# Patient Record
Sex: Female | Born: 1985 | Race: White | Hispanic: No | Marital: Single | Smoking: Current some day smoker
Health system: Southern US, Community
[De-identification: ages and names within clinical notes are randomized; demographics above are authoritative.]

## PROBLEM LIST (undated history)

## (undated) DIAGNOSIS — D649 Anemia, unspecified: Secondary | ICD-10-CM

## (undated) DIAGNOSIS — F4310 Post-traumatic stress disorder, unspecified: Secondary | ICD-10-CM

## (undated) DIAGNOSIS — F32A Depression, unspecified: Secondary | ICD-10-CM

## (undated) DIAGNOSIS — Z87442 Personal history of urinary calculi: Secondary | ICD-10-CM

## (undated) DIAGNOSIS — R059 Cough, unspecified: Secondary | ICD-10-CM

## (undated) DIAGNOSIS — F419 Anxiety disorder, unspecified: Secondary | ICD-10-CM

## (undated) DIAGNOSIS — F319 Bipolar disorder, unspecified: Secondary | ICD-10-CM

## (undated) DIAGNOSIS — F329 Major depressive disorder, single episode, unspecified: Secondary | ICD-10-CM

## (undated) DIAGNOSIS — J45909 Unspecified asthma, uncomplicated: Secondary | ICD-10-CM

## (undated) DIAGNOSIS — R519 Headache, unspecified: Secondary | ICD-10-CM

## (undated) DIAGNOSIS — R51 Headache: Secondary | ICD-10-CM

## (undated) DIAGNOSIS — R05 Cough: Secondary | ICD-10-CM

## (undated) HISTORY — DX: Depression, unspecified: F32.A

## (undated) HISTORY — PX: TONSILLECTOMY: SUR1361

## (undated) HISTORY — DX: Major depressive disorder, single episode, unspecified: F32.9

## (undated) HISTORY — PX: TUBAL LIGATION: SHX77

## (undated) HISTORY — PX: ABDOMINAL HYSTERECTOMY: SHX81

## (undated) HISTORY — PX: BREAST SURGERY: SHX581

## (undated) HISTORY — PX: HERNIA REPAIR: SHX51

## (undated) HISTORY — DX: Post-traumatic stress disorder, unspecified: F43.10

## (undated) HISTORY — DX: Anxiety disorder, unspecified: F41.9

## (undated) HISTORY — PX: CHOLECYSTECTOMY: SHX55

---
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
## 2011-10-09 ENCOUNTER — Inpatient Hospital Stay: Payer: Self-pay | Admitting: Psychiatry

## 2012-02-16 ENCOUNTER — Ambulatory Visit: Payer: Self-pay | Admitting: Family Medicine

## 2012-03-18 ENCOUNTER — Ambulatory Visit: Payer: Self-pay | Admitting: Medical

## 2012-03-18 LAB — URINALYSIS, COMPLETE
Blood: NEGATIVE
Leukocyte Esterase: NEGATIVE
Ph: 6 (ref 4.5–8.0)
RBC,UR: NONE SEEN /HPF (ref 0–5)
Specific Gravity: 1.025 (ref 1.003–1.030)

## 2012-03-18 LAB — COMPREHENSIVE METABOLIC PANEL
Alkaline Phosphatase: 68 U/L (ref 50–136)
Anion Gap: 8 (ref 7–16)
BUN: 7 mg/dL (ref 7–18)
Co2: 25 mmol/L (ref 21–32)
Creatinine: 0.65 mg/dL (ref 0.60–1.30)
EGFR (African American): >60
Osmolality: 267 (ref 275–301)
Potassium: 3.8 mmol/L (ref 3.5–5.1)
SGPT (ALT): 19 U/L
Sodium: 135 mmol/L — ABNORMAL LOW (ref 136–145)

## 2012-03-18 LAB — CBC WITH DIFFERENTIAL/PLATELET
Basophil #: 0 10*3/uL (ref 0.0–0.1)
Basophil %: 0.3 %
Eosinophil #: 0.2 10*3/uL (ref 0.0–0.7)
HCT: 33.7 % — ABNORMAL LOW (ref 35.0–47.0)
Lymphocyte #: 2.9 10*3/uL (ref 1.0–3.6)
Lymphocyte %: 22.8 %
MCH: 30.8 pg (ref 26.0–34.0)
MCV: 90 fL (ref 80–100)
Monocyte #: 0.9 x10 3/mm (ref 0.2–0.9)
Monocyte %: 6.9 %
Neutrophil %: 68.7 %
Platelet: 220 10*3/uL (ref 150–440)
RBC: 3.74 10*6/uL — ABNORMAL LOW (ref 3.80–5.20)
RDW: 13.6 % (ref 11.5–14.5)

## 2012-03-20 LAB — URINE CULTURE

## 2012-04-19 ENCOUNTER — Observation Stay: Payer: Self-pay | Admitting: Obstetrics and Gynecology

## 2013-03-04 DIAGNOSIS — J45909 Unspecified asthma, uncomplicated: Secondary | ICD-10-CM | POA: Insufficient documentation

## 2013-03-04 DIAGNOSIS — R0602 Shortness of breath: Secondary | ICD-10-CM | POA: Insufficient documentation

## 2013-06-12 ENCOUNTER — Ambulatory Visit: Payer: Self-pay | Admitting: Family Medicine

## 2013-11-20 ENCOUNTER — Ambulatory Visit: Payer: Self-pay | Admitting: Surgery

## 2013-11-20 LAB — PREGNANCY, URINE: Pregnancy Test, Urine: NEGATIVE m[IU]/mL

## 2013-11-22 LAB — PATHOLOGY REPORT

## 2013-11-29 ENCOUNTER — Emergency Department: Payer: Self-pay | Admitting: Internal Medicine

## 2014-03-08 DIAGNOSIS — F319 Bipolar disorder, unspecified: Secondary | ICD-10-CM | POA: Insufficient documentation

## 2014-04-25 DIAGNOSIS — R519 Headache, unspecified: Secondary | ICD-10-CM | POA: Insufficient documentation

## 2014-04-25 DIAGNOSIS — R202 Paresthesia of skin: Secondary | ICD-10-CM | POA: Insufficient documentation

## 2014-04-25 DIAGNOSIS — Z720 Tobacco use: Secondary | ICD-10-CM | POA: Insufficient documentation

## 2014-04-25 DIAGNOSIS — R51 Headache: Secondary | ICD-10-CM

## 2014-04-25 DIAGNOSIS — H538 Other visual disturbances: Secondary | ICD-10-CM | POA: Insufficient documentation

## 2014-04-25 DIAGNOSIS — F329 Major depressive disorder, single episode, unspecified: Secondary | ICD-10-CM | POA: Insufficient documentation

## 2014-04-25 DIAGNOSIS — F32A Depression, unspecified: Secondary | ICD-10-CM | POA: Insufficient documentation

## 2014-04-25 DIAGNOSIS — R251 Tremor, unspecified: Secondary | ICD-10-CM | POA: Insufficient documentation

## 2014-04-25 DIAGNOSIS — R200 Anesthesia of skin: Secondary | ICD-10-CM | POA: Insufficient documentation

## 2014-04-25 DIAGNOSIS — F17200 Nicotine dependence, unspecified, uncomplicated: Secondary | ICD-10-CM | POA: Insufficient documentation

## 2014-04-25 DIAGNOSIS — R262 Difficulty in walking, not elsewhere classified: Secondary | ICD-10-CM | POA: Insufficient documentation

## 2014-04-25 DIAGNOSIS — R479 Unspecified speech disturbances: Secondary | ICD-10-CM | POA: Insufficient documentation

## 2014-04-28 ENCOUNTER — Ambulatory Visit: Payer: Self-pay | Admitting: Physician Assistant

## 2014-04-28 LAB — URINALYSIS, COMPLETE
Bilirubin,UR: NEGATIVE
Glucose,UR: NEGATIVE mg/dL (ref 0–75)
Ketone: NEGATIVE
Nitrite: NEGATIVE
Ph: 6 (ref 4.5–8.0)
Protein: 100
Specific Gravity: 1.025 (ref 1.003–1.030)

## 2014-04-30 LAB — URINE CULTURE

## 2014-05-09 DIAGNOSIS — F419 Anxiety disorder, unspecified: Secondary | ICD-10-CM | POA: Insufficient documentation

## 2014-06-06 DIAGNOSIS — G479 Sleep disorder, unspecified: Secondary | ICD-10-CM | POA: Insufficient documentation

## 2014-09-02 ENCOUNTER — Ambulatory Visit: Payer: Self-pay | Admitting: Physician Assistant

## 2014-09-02 LAB — URINALYSIS, COMPLETE
Bilirubin,UR: NEGATIVE
GLUCOSE, UR: NEGATIVE
KETONE: NEGATIVE
Nitrite: NEGATIVE
PH: 6 (ref 5.0–8.0)
Protein: 100
Specific Gravity: >=1.03 (ref 1.000–1.030)

## 2014-09-02 LAB — PREGNANCY, URINE: Pregnancy Test, Urine: NEGATIVE m[IU]/mL

## 2014-09-04 LAB — URINE CULTURE

## 2014-09-18 DIAGNOSIS — M542 Cervicalgia: Secondary | ICD-10-CM | POA: Insufficient documentation

## 2014-09-18 DIAGNOSIS — G44221 Chronic tension-type headache, intractable: Secondary | ICD-10-CM | POA: Insufficient documentation

## 2014-09-18 DIAGNOSIS — R413 Other amnesia: Secondary | ICD-10-CM | POA: Insufficient documentation

## 2014-09-24 ENCOUNTER — Ambulatory Visit: Payer: Self-pay | Admitting: Neurology

## 2014-11-14 ENCOUNTER — Ambulatory Visit: Payer: Self-pay | Admitting: Otolaryngology

## 2014-12-01 HISTORY — PX: GALLBLADDER SURGERY: SHX652

## 2014-12-04 DIAGNOSIS — N390 Urinary tract infection, site not specified: Secondary | ICD-10-CM | POA: Insufficient documentation

## 2014-12-25 ENCOUNTER — Ambulatory Visit: Payer: Self-pay | Admitting: Neurology

## 2014-12-28 HISTORY — PX: TONSILLECTOMY AND ADENOIDECTOMY: SHX28

## 2015-02-08 NOTE — Op Note (Signed)
PATIENT NAME:  Bailey [name], Bailey [name] MR#:  [id number] DATE OF BIRTH:  Dec 09, 1985  DATE OF PROCEDURE:  11/20/2013  PREOPERATIVE DIAGNOSIS: Symptomatic cholelithiasis.   POSTOPERATIVE DIAGNOSIS: Symptomatic cholelithiasis.   PROCEDURE PERFORMED: Laparoscopic cholecystectomy.   ANESTHESIA: General.   SPECIMENS: Gallbladder.   ESTIMATED BLOOD LOSS: 15 mL.   COMPLICATIONS:  None.  INDICATION FOR SURGERY: [name] is a pleasant 29 year old female who presented to my office after recurrent episodes of right upper quadrant pain and gallstones. Her pain appeared biliary in nature, and I thought that she would benefit from laparoscopic cholecystectomy.   DETAILS OF PROCEDURE: As follows:  Informed consent was obtained. [name] was brought to the operating room suite. She was induced. Endotracheal tube was placed. General anesthesia was administered. Her abdomen was prepped and draped in standard surgical fashion. Timeout was then performed correctly identifying the patient name, operative site and procedure to be performed. A supraumbilical incision was made. This was deepened down to the fascia. The fascia was incised. The peritoneum was entered. I then placed 2 stay sutures in the fasciotomy. A Hasson trocar was placed in the abdomen and the abdomen was insufflated. An 11 mm epigastric port was placed and two 5 mm right subcostal ports were placed at the midclavicular line and anterior axillary line. The gallbladder was then retracted over the dome of the liver. There was a small amount of adhesions and distal stomach adhesions adhesed to the gallbladder. The gallbladder, cystic artery and cystic duct were then dissected out. The critical view was obtained. Three clips were placed on each structure and ligated. The gallbladder was then taken off the gallbladder fossa and taken out with an Endo Catch bag. The gallbladder fossa was then irrigated and made hemostatic. The trocars were then removed under  direct visualization. The pneumoperitoneum was evacuated. The supraumbilical fascia was closed with a figure-of-eight 0 Vicryl. All port sites were then closed with 4-0 Monocryl in deep dermal. Steri-Strips, Telfa gauze and Tegaderm completed the dressing. The patient was then awoken, extubated and brought to the postanesthesia care unit. There were no immediate complications. Needle, sponge and instrument counts were correct at the end of the procedure.    ____________________________ Si Raiderhristopher A. Shantaya Bluestone, MD cal:dmm D: 11/20/2013 12:29:54 ET T: 11/20/2013 12:45:07 ET JOB#: [id number]  cc: Cristal Deerhristopher A. Rita Vialpando, MD, <Dictator> Jarvis NewcomerHRISTOPHER A Yenesis Even MD ELECTRONICALLY SIGNED 11/23/2013 19:07

## 2015-02-10 LAB — SURGICAL PATHOLOGY

## 2015-05-05 ENCOUNTER — Ambulatory Visit (INDEPENDENT_AMBULATORY_CARE_PROVIDER_SITE_OTHER): Payer: Medicaid Other | Admitting: Psychiatry

## 2015-05-05 ENCOUNTER — Encounter: Payer: Self-pay | Admitting: Psychiatry

## 2015-05-05 ENCOUNTER — Telehealth: Payer: Self-pay

## 2015-05-05 VITALS — BP 118/78 | HR 86 | Temp 97.2°F | Ht 67.0 in | Wt 199.6 lb

## 2015-05-05 DIAGNOSIS — F331 Major depressive disorder, recurrent, moderate: Secondary | ICD-10-CM | POA: Diagnosis not present

## 2015-05-05 DIAGNOSIS — F4310 Post-traumatic stress disorder, unspecified: Secondary | ICD-10-CM

## 2015-05-05 DIAGNOSIS — F4001 Agoraphobia with panic disorder: Secondary | ICD-10-CM | POA: Diagnosis not present

## 2015-05-05 DIAGNOSIS — J45909 Unspecified asthma, uncomplicated: Secondary | ICD-10-CM | POA: Insufficient documentation

## 2015-05-05 MED ORDER — BUPROPION HCL ER (XL) 150 MG PO TB24: 150.0000 mg | ORAL_TABLET | Freq: Every day | ORAL | Status: DC

## 2015-05-05 MED ORDER — ALPRAZOLAM 1 MG PO TABS: 2.0000 mg | ORAL_TABLET | Freq: Two times a day (BID) | ORAL | Status: DC | PRN

## 2015-05-05 MED ORDER — ALPRAZOLAM 2 MG PO TABS: 2.0000 mg | ORAL_TABLET | Freq: Two times a day (BID) | ORAL | Status: DC | PRN

## 2015-05-05 MED ORDER — SERTRALINE HCL 100 MG PO TABS: 200.0000 mg | ORAL_TABLET | Freq: Every day | ORAL | Status: DC

## 2015-05-05 MED ORDER — QUETIAPINE FUMARATE 200 MG PO TABS: 300.0000 mg | ORAL_TABLET | Freq: Every day | ORAL | Status: DC
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
## 2015-05-05 NOTE — Telephone Encounter (Signed)
i had called [facility] and per the labtech they were unable to locate the labwork.  they did say that during the dates of 2-11th and 3-22nd that labwork was last during the transfer of medical info to the new system.  i did call [facility] just to make sure that patient did not go there for labwork.  Per [facility] no labwork was found for patient name and dob.  Pt was called and asked to make sure that labwork was done at the [facility] lab.  Pt confirm that labwork was done at [facility]. Pt states that it would have been done during the dates of feb -11th and march 22nd.  Pt states that if she needs to retake labwork it would be ok.  Please order and i'll contact pt that orders are ready.

## 2015-05-07 NOTE — Telephone Encounter (Signed)
Could not leave a message for patient . Will try to call again tomorrow

## 2015-05-12 NOTE — Telephone Encounter (Signed)
left message for her to call our office.

## 2015-05-13 NOTE — Telephone Encounter (Signed)
tried again today no answer could not leave a message voice mailbox full

## 2015-05-30 ENCOUNTER — Encounter: Payer: Self-pay | Admitting: Emergency Medicine

## 2015-05-30 ENCOUNTER — Ambulatory Visit
Admission: EM | Admit: 2015-05-30 | Discharge: 2015-05-30 | Disposition: A | Discharge status: Home / Self Care | Payer: Medicaid Other | Attending: Family Medicine | Admitting: Family Medicine

## 2015-05-30 DIAGNOSIS — E119 Type 2 diabetes mellitus without complications: Secondary | ICD-10-CM | POA: Diagnosis not present

## 2015-05-30 DIAGNOSIS — F329 Major depressive disorder, single episode, unspecified: Secondary | ICD-10-CM | POA: Insufficient documentation

## 2015-05-30 DIAGNOSIS — F4310 Post-traumatic stress disorder, unspecified: Secondary | ICD-10-CM | POA: Diagnosis not present

## 2015-05-30 DIAGNOSIS — F419 Anxiety disorder, unspecified: Secondary | ICD-10-CM | POA: Diagnosis not present

## 2015-05-30 DIAGNOSIS — N390 Urinary tract infection, site not specified: Secondary | ICD-10-CM | POA: Insufficient documentation

## 2015-05-30 DIAGNOSIS — N189 Chronic kidney disease, unspecified: Secondary | ICD-10-CM | POA: Insufficient documentation

## 2015-05-30 DIAGNOSIS — F17210 Nicotine dependence, cigarettes, uncomplicated: Secondary | ICD-10-CM | POA: Insufficient documentation

## 2015-05-30 DIAGNOSIS — R300 Dysuria: Secondary | ICD-10-CM | POA: Diagnosis present

## 2015-05-30 LAB — BASIC METABOLIC PANEL
Anion gap: 8 (ref 5–15)
BUN: 8 mg/dL (ref 6–20)
CALCIUM: 9.4 mg/dL (ref 8.9–10.3)
CO2: 25 mmol/L (ref 22–32)
Chloride: 104 mmol/L (ref 101–111)
Creatinine, Ser: 0.78 mg/dL (ref 0.44–1.00)
GFR calc Af Amer: >60 mL/min (ref >60)
GLUCOSE: 81 mg/dL (ref 65–99)
Potassium: 3.9 mmol/L (ref 3.5–5.1)
SODIUM: 137 mmol/L (ref 135–145)

## 2015-05-30 LAB — URINALYSIS COMPLETE WITH MICROSCOPIC (ARMC ONLY)
Glucose, UA: NEGATIVE mg/dL
Ketones, ur: NEGATIVE mg/dL
Nitrite: NEGATIVE
Protein, ur: 100 mg/dL — AB
SPECIFIC GRAVITY, URINE: 1.03 (ref 1.005–1.030)
pH: 6 (ref 5.0–8.0)

## 2015-05-30 MED ORDER — FLUCONAZOLE 150 MG PO TABS: 150.0000 mg | ORAL_TABLET | Freq: Every day | ORAL | Status: DC

## 2015-05-30 MED ORDER — CIPROFLOXACIN HCL 500 MG PO TABS: 500.0000 mg | ORAL_TABLET | Freq: Two times a day (BID) | ORAL | Status: DC

## 2015-05-30 NOTE — ED Provider Notes (Signed)
CSN: [id number]     Arrival date & time 05/30/15  1354 History   First MD Initiated Contact with Patient 05/30/15 1552     Chief Complaint  Patient presents with  . Dysuria   (Consider location/radiation/quality/duration/timing/severity/associated sxs/prior Treatment) HPI Comments: 29 yo female with a 2 days h/o urinary frequency and slight discomfort with urination. States has a h/o frequent UTIs and h/o pyelonephritis. Denies any fevers, chills, nausea, vomiting. Felt feverish today.   The history is provided by the patient.    Past Medical History  Diagnosis Date  . Diabetes mellitus, type II   . Chronic kidney disease   . PTSD (post-traumatic stress disorder)   . Depression   . Anxiety    Past Surgical History  Procedure Laterality Date  . Tonsillectomy and adenoidectomy  [id number]  . Breast reduction surgery  2004  . Gallbladder surgery  [id number]  . Cesarean section    . Breast surgery    . Tubal ligation     Family History  Problem Relation Age of Onset  . Family history unknown: Yes   Social History  Substance Use Topics  . Smoking status: Current Some Day Smoker -- 0.50 packs/day    Types: Cigarettes    Start date: 05/05/2003  . Smokeless tobacco: Never Used  . Alcohol Use: No     Comment: holiday   OB History    No data available     Review of Systems  Allergies  Sumatriptan and Hydrocodone-acetaminophen  Home Medications   Prior to Admission medications   Medication Sig Start Date End Date Taking? Authorizing Provider  albuterol (PROAIR HFA) 108 (90 BASE) MCG/ACT inhaler Inhale into the lungs. 03/04/13   Historical Provider, MD  ALPRAZolam Prudy Feeler) 2 MG tablet Take 1 tablet (2 mg total) by mouth 2 (two) times daily as needed for anxiety. 05/05/15   Kerin Salen, MD  buPROPion (WELLBUTRIN XL) 150 MG 24 hr tablet Take 1 tablet (150 mg total) by mouth daily. 05/05/15   Kerin Salen, MD  ciprofloxacin (CIPRO) 500 MG tablet Take 1 tablet (500 mg  total) by mouth every 12 (twelve) hours. 05/30/15   Payton Mccallum, MD  fluconazole (DIFLUCAN) 150 MG tablet Take 1 tablet (150 mg total) by mouth daily. 05/30/15   Payton Mccallum, MD  Ipratropium-Albuterol (COMBIVENT) 20-100 MCG/ACT AERS respimat Inhale into the lungs. 09/18/14 09/18/15  Historical Provider, MD  levonorgestrel (MIRENA) 20 MCG/24HR IUD by Intrauterine route.    Historical Provider, MD  QUEtiapine (SEROQUEL) 200 MG tablet Take 1.5 tablets (300 mg total) by mouth at bedtime. 05/05/15   Kerin Salen, MD  sertraline (ZOLOFT) 100 MG tablet Take 2 tablets (200 mg total) by mouth daily. 05/05/15   Kerin Salen, MD   BP 125/63 mmHg  Pulse 78  Temp(Src) 98.1 F (36.7 C) (Tympanic)  Resp 16  Ht  (1.727 m)  Wt 198 lb (89.812 kg)  BMI 30.11 kg/m2  SpO2 99% Physical Exam  Constitutional: She appears well-developed and well-nourished. No distress.  Abdominal: Soft. Bowel sounds are normal. She exhibits no distension and no mass. There is no tenderness. There is no rebound and no guarding.  Skin: She is not diaphoretic.  Nursing note and vitals reviewed.   ED Course  Procedures (including critical care time) Labs Review Labs Reviewed  URINALYSIS COMPLETEWITH MICROSCOPIC [facility] ONLY) - Abnormal; Notable for the following:    APPearance TURBID (*)    Bilirubin Urine 1+ (*)  Hgb urine dipstick 3+ (*)    Protein, ur 100 (*)    Leukocytes, UA 2+ (*)    Bacteria, UA FEW (*)    Squamous Epithelial / LPF 0-5 (*)    All other components within normal limits  URINE CULTURE  BASIC METABOLIC PANEL    Imaging Review No results found.   MDM   1. UTI (lower urinary tract infection)    New Prescriptions   CIPROFLOXACIN (CIPRO) 500 MG TABLET    Take 1 tablet (500 mg total) by mouth every 12 (twelve) hours.   FLUCONAZOLE (DIFLUCAN) 150 MG TABLET    Take 1 tablet (150 mg total) by mouth daily.  Plan: 1. Test results and diagnosis reviewed with patient 2. rx as per orders;  risks, benefits, potential side effects reviewed with patient 3. Recommend supportive treatment with increased fluids 4. F/u prn if symptoms worsen or don't improve    Payton Mccallum, MD 05/30/15 1711

## 2015-05-30 NOTE — ED Notes (Signed)
Patient c/o burning when urinating, and increase in urinary frequency for 2 days.  Patient report fever today.

## 2015-06-01 LAB — URINE CULTURE: Culture: >=100000

## 2015-07-07 ENCOUNTER — Ambulatory Visit: Payer: Medicaid Other | Admitting: Psychiatry

## 2015-07-29 ENCOUNTER — Ambulatory Visit (INDEPENDENT_AMBULATORY_CARE_PROVIDER_SITE_OTHER): Payer: Medicaid Other | Admitting: Psychiatry

## 2015-07-29 ENCOUNTER — Encounter: Payer: Self-pay | Admitting: Psychiatry

## 2015-07-29 ENCOUNTER — Telehealth: Payer: Self-pay

## 2015-07-29 VITALS — BP 122/76 | HR 92 | Temp 97.3°F | Ht 68.0 in | Wt 196.4 lb

## 2015-07-29 DIAGNOSIS — Z87898 Personal history of other specified conditions: Secondary | ICD-10-CM | POA: Insufficient documentation

## 2015-07-29 DIAGNOSIS — F319 Bipolar disorder, unspecified: Secondary | ICD-10-CM | POA: Insufficient documentation

## 2015-07-29 DIAGNOSIS — F331 Major depressive disorder, recurrent, moderate: Secondary | ICD-10-CM | POA: Diagnosis not present

## 2015-07-29 DIAGNOSIS — F4001 Agoraphobia with panic disorder: Secondary | ICD-10-CM | POA: Insufficient documentation

## 2015-07-29 DIAGNOSIS — F411 Generalized anxiety disorder: Secondary | ICD-10-CM | POA: Insufficient documentation

## 2015-07-29 DIAGNOSIS — F4310 Post-traumatic stress disorder, unspecified: Secondary | ICD-10-CM | POA: Diagnosis not present

## 2015-07-29 DIAGNOSIS — Z8669 Personal history of other diseases of the nervous system and sense organs: Secondary | ICD-10-CM | POA: Insufficient documentation

## 2015-07-29 DIAGNOSIS — F603 Borderline personality disorder: Secondary | ICD-10-CM | POA: Insufficient documentation

## 2015-07-29 DIAGNOSIS — Z8541 Personal history of malignant neoplasm of cervix uteri: Secondary | ICD-10-CM | POA: Insufficient documentation

## 2015-07-29 DIAGNOSIS — O24419 Gestational diabetes mellitus in pregnancy, unspecified control: Secondary | ICD-10-CM | POA: Insufficient documentation

## 2015-07-29 DIAGNOSIS — G4700 Insomnia, unspecified: Secondary | ICD-10-CM | POA: Insufficient documentation

## 2015-07-29 MED ORDER — PRAZOSIN HCL 1 MG PO CAPS: ORAL_CAPSULE | ORAL | Status: DC

## 2015-07-29 MED ORDER — QUETIAPINE FUMARATE 200 MG PO TABS: 300.0000 mg | ORAL_TABLET | Freq: Every day | ORAL | Status: DC

## 2015-07-29 MED ORDER — ALPRAZOLAM 2 MG PO TABS: 2.0000 mg | ORAL_TABLET | Freq: Two times a day (BID) | ORAL | Status: DC | PRN

## 2015-07-29 MED ORDER — SERTRALINE HCL 100 MG PO TABS: 200.0000 mg | ORAL_TABLET | Freq: Every day | ORAL | Status: DC

## 2015-07-29 MED ORDER — BUPROPION HCL ER (XL) 150 MG PO TB24: 150.0000 mg | ORAL_TABLET | Freq: Every day | ORAL | Status: DC

## 2015-07-29 NOTE — Telephone Encounter (Signed)
prior auth approved [id number]  end on  07-23-16 ref # [id number]

## 2015-07-29 NOTE — Progress Notes (Signed)
BH MD/PA/NP OP Progress Note  07/29/2015 3:09 PM Bailey [name]  MRN:  [id number]  Subjective:  Patient returns for follow-up of her PTSD, major depressive disorder and panic. She states that she is not really feeling depressed. She indicates her biggest issue is intrusive thoughts and flashbacks and nightmares. She states that good things have happened in her life and that she now has her own home. She states her daughter is there with her and then periodically her son is also there. She states that perhaps she is experiencing somewhat more re experiencing symptoms because she is now in her own environment. She states in the past she's had a female figure there or been around others. She states she continues to feel like the medications overall are helpful to her. She states the Seroquel is been helpful in terms of helping her sleep. She states that her panic attacks are generally under good control with her alprazolam.  She states she continues to go to the [facility] for trauma therapy. However she states is been an interruption because her peer counselor gone to some type of trouble and is been suspended. She states she's talk with the manager who indicate they will either get her another peer counselor or assess what is going on with her current counselor. Chief Complaint: Flashbacks Chief Complaint    Follow-up; Medication Refill     Visit Diagnosis:     ICD-9-CM ICD-10-CM   1. PTSD (post-traumatic stress disorder) 309.81 F43.10   2. 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 Major depressive disorder, recurrent episode, moderate (HCC) 296.32 F33.1     Past Medical History:  Past Medical History  Diagnosis Date  . Diabetes mellitus, type II (HCC)   . Chronic kidney disease   . PTSD (post-traumatic stress disorder)   . Depression   . Anxiety     Past Surgical History  Procedure Laterality Date  . Tonsillectomy and adenoidectomy  40981191  . Breast reduction surgery   2004  . Gallbladder surgery  [id number]  . Cesarean section    . Breast surgery    . Tubal ligation     Family History:  Family History  Problem Relation Age of Onset  . Hypertension Mother   . Anxiety disorder Mother   . Depression Mother   . Anxiety disorder Father   . Depression Father   . Diabetes Maternal Aunt   . Diabetes Maternal Grandfather    Social History:  Social History   Social History  . Marital Status: Single    Spouse Name: N/A  . Number of Children: N/A  . Years of Education: N/A   Social History Main Topics  . Smoking status: Current Some Day Smoker -- 0.50 packs/day    Types: Cigarettes    Start date: 05/05/2003  . Smokeless tobacco: Never Used  . Alcohol Use: No     Comment: holiday  . Drug Use: No  . Sexual Activity: Yes    Birth Control/ Protection: IUD   Other Topics Concern  . None   Social History Narrative   Additional History:   Assessment:   Musculoskeletal: Strength & Muscle Tone: within normal limits Gait & Station: normal Patient leans: N/A  Psychiatric Specialty Exam: HPI  Review of Systems  Psychiatric/Behavioral: Negative for depression, suicidal ideas, hallucinations, memory loss and substance abuse. The patient has insomnia. The patient is not nervous/anxious.   All other systems reviewed and are negative.   Blood pressure 122/76, pulse  92, temperature 97.3 F (36.3 C), temperature source Tympanic, height  (1.727 m), weight 196 lb 6.4 oz (89.086 kg), SpO2 96 %.Body mass index is 29.87 kg/(m^2).  General Appearance: Well Groomed  Eye Contact:  Good  Speech:  Normal Rate  Volume:  Normal  Mood:  Okay  Affect:  Appropriate and Congruent  Thought Process:  Linear and Logical  Orientation:  Full (Time, Place, and Person)  Thought Content:  Negative  Suicidal Thoughts:  No  Homicidal Thoughts:  No  Memory:  Immediate;   Good Recent;   Good Remote;   Good  Judgement:  Good  Insight:  Good  Psychomotor  Activity:  Negative  Concentration:  Good  Recall:  Good  Fund of Knowledge: Good  Language: Good  Akathisia:  Negative  Handed:  Right unknown   AIMS (if indicated):  Not done today  Assets:  Communication Skills Desire for Improvement  ADL's:  Intact  Cognition: WNL  Sleep:  poor   Is the patient at risk to self?  No. Has the patient been a risk to self in the past 6 months?  No. Has the patient been a risk to self within the distant past?  Yes.   2012-2013 Is the patient a risk to others?  No. Has the patient been a risk to others in the past 6 months?  No. Has the patient been a risk to others within the distant past?  No.  Current Medications: Current Outpatient Prescriptions  Medication Sig Dispense Refill  . albuterol (PROAIR HFA) 108 (90 BASE) MCG/ACT inhaler Inhale into the lungs.    Marland Kitchen alprazolam (XANAX) 2 MG tablet Take 1 tablet (2 mg total) by mouth 2 (two) times daily as needed for anxiety. 60 tablet 3  . levonorgestrel (MIRENA) 20 MCG/24HR IUD by Intrauterine route.    Marland Kitchen QUEtiapine (SEROQUEL) 200 MG tablet Take 1.5 tablets (300 mg total) by mouth at bedtime. 45 tablet 3  . sertraline (ZOLOFT) 100 MG tablet Take 2 tablets (200 mg total) by mouth daily. 60 tablet 3  . buPROPion (WELLBUTRIN XL) 150 MG 24 hr tablet Take 1 tablet (150 mg total) by mouth daily. 30 tablet 3  . prazosin (MINIPRESS) 1 MG capsule TAKE ONE TABLETS AT BEDTIME FOR SVEN DAYS THEN INCREASE TO TWO TABLETS AT BEDTIME. 60 capsule 1   No current facility-administered medications for this visit.    Medical Decision Making:  Established Problem, Stable/Improving (1) and Review of New Medication or Change in Dosage (2)  Treatment Plan Summary:Medication management and Plan   PTSD We will continue the sertraline at 200 mg daily, we'll continue her Wellbutrin XL 150 mg daily, we will continue the alprazolam at 2 mg twice a day as needed for anxiety. Continue quetiapine  300 mg at bedtime. Given her report  of re experiencing symptoms of PTSD will initiate some prazosin 1 mg at bedtime for 7 days and then she'll increase to 2 mg at bedtime. Risk and benefits of been discussing patient's able to consent.     Panic disorder-continue alprazolam 2 mg twice a day as needed for anxiety.  Major depressive disorder-stable on her sertraline and Wellbutrin XL as noted above  Patient will follow up in 1 month.      Wallace Going 07/29/2015, 3:09 PM

## 2015-07-29 NOTE — Telephone Encounter (Signed)
prior auth needed on quetiapine fumarate 

## 2015-08-22 DIAGNOSIS — K439 Ventral hernia without obstruction or gangrene: Secondary | ICD-10-CM | POA: Insufficient documentation

## 2015-08-28 ENCOUNTER — Telehealth: Payer: Self-pay | Admitting: Psychiatry

## 2015-08-29 ENCOUNTER — Ambulatory Visit: Payer: Self-pay | Admitting: Psychiatry

## 2015-09-24 ENCOUNTER — Encounter: Payer: Self-pay | Admitting: Psychiatry

## 2015-09-24 ENCOUNTER — Ambulatory Visit (INDEPENDENT_AMBULATORY_CARE_PROVIDER_SITE_OTHER): Payer: Medicaid Other | Admitting: Psychiatry

## 2015-09-24 VITALS — BP 122/86 | HR 84 | Temp 97.8°F | Ht 68.0 in | Wt 203.6 lb

## 2015-09-24 DIAGNOSIS — F4001 Agoraphobia with panic disorder: Secondary | ICD-10-CM | POA: Diagnosis not present

## 2015-09-24 DIAGNOSIS — F4310 Post-traumatic stress disorder, unspecified: Secondary | ICD-10-CM | POA: Diagnosis not present

## 2015-09-24 DIAGNOSIS — F331 Major depressive disorder, recurrent, moderate: Secondary | ICD-10-CM

## 2015-09-24 MED ORDER — ZOLPIDEM TARTRATE 5 MG PO TABS: 5.0000 mg | ORAL_TABLET | Freq: Every evening | ORAL | Status: DC | PRN

## 2015-09-25 ENCOUNTER — Encounter: Payer: Self-pay | Admitting: Psychiatry

## 2015-09-25 NOTE — Progress Notes (Signed)
BH MD/PA/NP OP Progress Note  09/25/2015 8:29 AM Bailey [name]  MRN:  [id number]  Subjective:  Patient returns for follow-up of her PTSD, major depressive disorder and panic. She indicates overall things are [name] fairly well for her. At the last visit we had tried some prazosin  for trauma related nightmares. Patient states she did not notice any benefit from it and inquires about trying other medications for sleep.  He related that she had her surgery related to her hernia and is recovering from that. She states she has an upcoming appointment with her primary care because she still having some pain related to it.  She indicates her panic attacks continue to be under control with her alprazolam. She denies any side effects from it or her other medications. Chief Complaint: sleep Chief Complaint    Follow-up; Medication Refill     Visit Diagnosis:     ICD-9-CM ICD-10-CM   1. PTSD (post-traumatic stress disorder) 309.81 F43.10   2. 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 Major depressive disorder, recurrent episode, moderate (HCC) 296.32 F33.1     Past Medical History:  Past Medical History  Diagnosis Date  . Diabetes mellitus, type II (HCC)   . Chronic kidney disease   . PTSD (post-traumatic stress disorder)   . Depression   . Anxiety     Past Surgical History  Procedure Laterality Date  . Tonsillectomy and adenoidectomy  [id number]  . Breast reduction surgery  2004  . Gallbladder surgery  [id number]  . Cesarean section    . Breast surgery    . Tubal ligation    . Hernia repair     Family History:  Family History  Problem Relation Age of Onset  . Hypertension Mother   . Anxiety disorder Mother   . Depression Mother   . Anxiety disorder Father   . Depression Father   . Diabetes Maternal Aunt   . Diabetes Maternal Grandfather    Social History:  Social History   Social History  . Marital Status: Single    Spouse Name: N/A  . Number of Children: N/A   . Years of Education: N/A   Social History Main Topics  . Smoking status: Current Some Day Smoker -- 0.50 packs/day    Types: Cigarettes    Start date: 05/05/2003  . Smokeless tobacco: Never Used  . Alcohol Use: No     Comment: holiday  . Drug Use: No  . Sexual Activity: Yes    Birth Control/ Protection: IUD   Other Topics Concern  . None   Social History Narrative   Additional History:   Assessment:   Musculoskeletal: Strength & Muscle Tone: within normal limits Gait & Station: normal Patient leans: N/A  Psychiatric Specialty Exam: HPI  Review of Systems  Psychiatric/Behavioral: Negative for depression, suicidal ideas, hallucinations, memory loss and substance abuse. The patient has insomnia. The patient is not nervous/anxious.   All other systems reviewed and are negative.   Blood pressure 122/86, pulse 84, temperature 97.8 F (36.6 C), temperature source Tympanic, height 5\' 8"  (1.727 m), weight 203 lb 9.6 oz (92.352 kg), SpO2 97 %.Body mass index is 30.96 kg/(m^2).  General Appearance: Well Groomed  Eye Contact:  Good  Speech:  Normal Rate  Volume:  Normal  Mood:  Okay  Affect:  Appropriate and Congruent  Thought Process:  Linear and Logical  Orientation:  Full (Time, Place, and Person)  Thought Content:  Negative  Suicidal Thoughts:  No  Homicidal Thoughts:  No  Memory:  Immediate;   Good Recent;   Good Remote;   Good  Judgement:  Good  Insight:  Good  Psychomotor Activity:  Negative  Concentration:  Good  Recall:  Good  Fund of Knowledge: Good  Language: Good  Akathisia:  Negative  Handed:  Right unknown   AIMS (if indicated):  Not done today  Assets:  Communication Skills Desire for Improvement  ADL's:  Intact  Cognition: WNL  Sleep:  poor   Is the patient at risk to self?  No. Has the patient been a risk to self in the past 6 months?  No. Has the patient been a risk to self within the distant past?  Yes.   2012-2013 Is the patient a risk to  others?  No. Has the patient been a risk to others in the past 6 months?  No. Has the patient been a risk to others within the distant past?  No.  Current Medications: Current Outpatient Prescriptions  Medication Sig Dispense Refill  . albuterol (PROAIR HFA) 108 (90 BASE) MCG/ACT inhaler Inhale into the lungs.    Marland Kitchen alprazolam (XANAX) 2 MG tablet Take 1 tablet (2 mg total) by mouth 2 (two) times daily as needed for anxiety. 60 tablet 3  . buPROPion (WELLBUTRIN XL) 150 MG 24 hr tablet Take 1 tablet (150 mg total) by mouth daily. 30 tablet 3  . levonorgestrel (MIRENA) 20 MCG/24HR IUD by Intrauterine route.    Marland Kitchen QUEtiapine (SEROQUEL) 200 MG tablet Take 1.5 tablets (300 mg total) by mouth at bedtime. 45 tablet 3  . sertraline (ZOLOFT) 100 MG tablet Take 2 tablets (200 mg total) by mouth daily. 60 tablet 3  . zolpidem (AMBIEN) 5 MG tablet Take 1 tablet (5 mg total) by mouth at bedtime as needed for sleep. 30 tablet 1   No current facility-administered medications for this visit.    Medical Decision Making:  Established Problem, Stable/Improving (1) and Review of New Medication or Change in Dosage (2)  Treatment Plan Summary:Medication management and Plan   PTSD We will continue the sertraline at 200 mg daily, we'll continue her Wellbutrin XL 150 mg daily, we will continue the alprazolam at 2 mg twice a day as needed for anxiety. Continue quetiapine  300 mg at bedtime. Risks and discontinue the prazosin. We'll start some Ambien 5 mg at bedtime as needed. Risk and benefits of been discussing patient's able to consent.  Panic disorder-continue alprazolam 2 mg twice a day as needed for anxiety.  Major depressive disorder-stable on her sertraline and Wellbutrin XL as noted above  I discussed my departure with patient and that she would be transferred to another provider in this clinic. Patient will follow up in 1 month.      Bailey [name] 09/25/2015, 8:29 AM

## 2015-10-14 NOTE — Progress Notes (Signed)
Still taking all medication  - these were refilled

## 2015-10-22 ENCOUNTER — Ambulatory Visit: Payer: Medicaid Other | Admitting: Psychiatry

## 2015-10-29 ENCOUNTER — Ambulatory Visit (INDEPENDENT_AMBULATORY_CARE_PROVIDER_SITE_OTHER): Payer: Medicaid Other | Admitting: Psychiatry

## 2015-10-29 ENCOUNTER — Encounter: Payer: Self-pay | Admitting: Psychiatry

## 2015-10-29 VITALS — BP 128/78 | HR 105 | Temp 97.2°F | Ht 68.0 in | Wt 214.2 lb

## 2015-10-29 DIAGNOSIS — F4310 Post-traumatic stress disorder, unspecified: Secondary | ICD-10-CM

## 2015-10-29 DIAGNOSIS — F4001 Agoraphobia with panic disorder: Secondary | ICD-10-CM | POA: Diagnosis not present

## 2015-10-29 DIAGNOSIS — F331 Major depressive disorder, recurrent, moderate: Secondary | ICD-10-CM | POA: Diagnosis not present

## 2015-10-29 MED ORDER — ALPRAZOLAM 2 MG PO TABS: 2.0000 mg | ORAL_TABLET | Freq: Two times a day (BID) | ORAL | Status: DC | PRN

## 2015-10-29 MED ORDER — SERTRALINE HCL 100 MG PO TABS: 200.0000 mg | ORAL_TABLET | Freq: Every day | ORAL | Status: DC

## 2015-10-29 MED ORDER — BUPROPION HCL ER (XL) 150 MG PO TB24: 150.0000 mg | ORAL_TABLET | Freq: Every day | ORAL | Status: DC

## 2015-10-29 MED ORDER — ZOLPIDEM TARTRATE 5 MG PO TABS: 5.0000 mg | ORAL_TABLET | Freq: Every evening | ORAL | Status: DC | PRN

## 2015-10-29 MED ORDER — QUETIAPINE FUMARATE 200 MG PO TABS: 300.0000 mg | ORAL_TABLET | Freq: Every day | ORAL | Status: DC

## 2015-10-29 NOTE — Progress Notes (Signed)
BH MD/PA/NP OP Progress Note  10/29/2015 12:11 PM Bailey [name]  MRN:  [id number]  Subjective:  Patient returns for follow-up of her PTSD, major depressive disorder and panic sort with agoraphobia. She states today her main issue is just been irritability. She states that it's typically around those that she's close to and does not occur as much in public. She states for example her depression and irritability did start around the holidays. She states some of this is that she was not able to give her daughter all the thing she would've liked to. She states that she is irritable when she wakes up in the morning and that it tends to subside during the middle of the day and then returned later in the day. We spent some time trying to look at patterns and what is going on at these different times. She was able to state that when her irritability is better is when she is alone and then her boyfriend arrives home and then she'll take things out on the boyfriend. Discussed good outlets and distraction for her emotions. She states she has been walking and does use coloring books which she finds somewhat helpful. After continued discussion she did indicate that with her current therapist she's been exploring her childhood and she is currently at age 30. She thinks that perhaps with their exploring may be what's causing her to have increased irritability. She also discussed that as her 9-year-old daughter not is dependent on her that is not as much of the distraction that it used to be from her emotions.  He was the current medications have been working and she is not interested in any changes at this time. She denies any side effects from them. She does state however that her pharmacy is only been dispensing 15 of her Ambien per month and that her bottles had the instructions to take them every other night. Chief Complaint: Irritable Chief Complaint    Follow-up; Medication Refill; Other; Weight Gain     Visit  Diagnosis:     ICD-9-CM ICD-10-CM   1. PTSD (post-traumatic stress disorder) 309.81 F43.10   2. 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 Major depressive disorder, recurrent episode, moderate (HCC) 296.32 F33.1     Past Medical History:  Past Medical History  Diagnosis Date  . Diabetes mellitus, type II (HCC)   . Chronic kidney disease   . PTSD (post-traumatic stress disorder)   . Depression   . Anxiety     Past Surgical History  Procedure Laterality Date  . Tonsillectomy and adenoidectomy  [id number]  . Breast reduction surgery  2004  . Gallbladder surgery  [id number]  . Cesarean section    . Breast surgery    . Tubal ligation    . Hernia repair     Family History:  Family History  Problem Relation Age of Onset  . Hypertension Mother   . Anxiety disorder Mother   . Depression Mother   . Anxiety disorder Father   . Depression Father   . Diabetes Maternal Aunt   . Diabetes Maternal Grandfather    Social History:  Social History   Social History  . Marital Status: Single    Spouse Name: N/A  . Number of Children: N/A  . Years of Education: N/A   Social History Main Topics  . Smoking status: Current Some Day Smoker -- 0.50 packs/day    Types: Cigarettes    Start date: 05/05/2003  . Smokeless tobacco:  Never Used  . Alcohol Use: No     Comment: holiday  . Drug Use: No  . Sexual Activity: Yes    Birth Control/ Protection: IUD   Other Topics Concern  . None   Social History Narrative   Additional History:   Assessment:   Musculoskeletal: Strength & Muscle Tone: within normal limits Gait & Station: normal Patient leans: N/A  Psychiatric Specialty Exam: HPI  Review of Systems  Psychiatric/Behavioral: Negative for depression, suicidal ideas, hallucinations, memory loss and substance abuse. The patient is not nervous/anxious and does not have insomnia (previous complaints of insomnia has responded to Ambien).   All other systems reviewed and are  negative.   Blood pressure 128/78, pulse 105, temperature 97.2 F (36.2 C), temperature source Tympanic, height 5\' 8"  (1.727 m), weight 214 lb 3.2 oz (97.16 kg), SpO2 97 %.Body mass index is 32.58 kg/(m^2).  General Appearance: Well Groomed  Eye Contact:  Good  Speech:  Normal Rate  Volume:  Normal  Mood:  Angry  Affect:  Constricted  Thought Process:  Linear and Logical  Orientation:  Full (Time, Place, and Person)  Thought Content:  Negative  Suicidal Thoughts:  No  Homicidal Thoughts:  No  Memory:  Immediate;   Good Recent;   Good Remote;   Good  Judgement:  Good  Insight:  Good  Psychomotor Activity:  Negative  Concentration:  Good  Recall:  Good  Fund of Knowledge: Good  Language: Good  Akathisia:  Negative  Handed:  Right unknown   AIMS (if indicated):  Not done today  Assets:  Communication Skills Desire for Improvement  ADL's:  Intact  Cognition: WNL  Sleep:  poor   Is the patient at risk to self?  No. Has the patient been a risk to self in the past 6 months?  No. Has the patient been a risk to self within the distant past?  Yes.   2012-2013 Is the patient a risk to others?  No. Has the patient been a risk to others in the past 6 months?  No. Has the patient been a risk to others within the distant past?  No.  Current Medications: Current Outpatient Prescriptions  Medication Sig Dispense Refill  . albuterol (PROAIR HFA) 108 (90 BASE) MCG/ACT inhaler Inhale into the lungs.    Marland Kitchen alprazolam (XANAX) 2 MG tablet Take 1 tablet (2 mg total) by mouth 2 (two) times daily as needed for anxiety. 60 tablet 4  . buPROPion (WELLBUTRIN XL) 150 MG 24 hr tablet Take 1 tablet (150 mg total) by mouth daily. 30 tablet 4  . levonorgestrel (MIRENA) 20 MCG/24HR IUD by Intrauterine route.    Marland Kitchen QUEtiapine (SEROQUEL) 200 MG tablet Take 1.5 tablets (300 mg total) by mouth at bedtime. 45 tablet 4  . sertraline (ZOLOFT) 100 MG tablet Take 2 tablets (200 mg total) by mouth daily. 60 tablet  4  . zolpidem (AMBIEN) 5 MG tablet Take 1 tablet (5 mg total) by mouth at bedtime as needed for sleep. 15 tablet 4   No current facility-administered medications for this visit.    Medical Decision Making:  Established Problem, Stable/Improving (1) and Review of New Medication or Change in Dosage (2)  Treatment Plan Summary:Medication management and Plan   PTSD We will continue the sertraline at 200 mg daily, we'll continue her Wellbutrin XL 150 mg daily, we will continue the alprazolam at 2 mg twice a day as needed for anxiety. Continue quetiapine  300 mg at  bedtime. Continue Ambien 5 mg at bedtime as needed. Risk and benefits of been discussing patient's able to consent.  Panic disorder-continue alprazolam 2 mg twice a day as needed for anxiety.  Major depressive disorder-stable on her sertraline and Wellbutrin XL as noted above  Patient is aware of my departure with patient and that she would be transferred to another provider in this clinic. Patient will follow up in 3 month. He is been encouraged to call with any questions or concerns prior to her next appointment.     Wallace Goinglton Jsiah Menta 10/29/2015, 12:11 PM

## 2015-11-04 ENCOUNTER — Telehealth: Payer: Self-pay | Admitting: Psychiatry

## 2015-11-04 ENCOUNTER — Telehealth: Payer: Self-pay

## 2015-11-04 NOTE — Telephone Encounter (Signed)
rx oked to refill early this one time only.

## 2015-11-04 NOTE — Telephone Encounter (Signed)
according to pharmacy no id scanned in for this type of medication.  rx was filled on  10-21-15 picked up on  10-22-15- at 9:00pm.  per the pharmacy pt told them that she must have lost it moving. She said that yes she moved but she didn't think she lost in moving.  She strongly states she gets all medication at the same time and does not understand what happen.

## 2015-11-04 NOTE — Telephone Encounter (Signed)
called pharmacy , spoke with Leonette Most, gave the ok per dr. Mayford Knife to go ahead and refill this medication early.

## 2015-11-04 NOTE — Telephone Encounter (Signed)
according to pharmacy no id scanned in for this type of medication.  rx was filled on  10-21-15 picked up on  10-22-15- at 9:00pm.  per the pharmacy pt told them that she must have lost it moving.

## 2015-11-04 NOTE — Telephone Encounter (Signed)
Pharmacy contacting the office to see whether patient get her alprazolam prescription early. Per pharmacy the prescription was picked up on January 4. This would've been the last refill on the patient's prescription that was provided to her in October. Patient case she never picked that up. Have contacted the patient's pharmacy and they not logged IDs for that medication? Overall patient has been reliable Ms. never been any issues with misuse with her alprazolam. We are going to fill at this time however patient will be instructed that she will need to make sure that she's follows up on her filling her medications, fills them herself so that this clinic will not have to authorize refills. Given patient is never any issues in the past Ritalin authorize her to have her prescription filled at this time. Patient states that it would be unusual for her to get that single medication, alprazolam on the fourth as she states she picks up all of her medications at the same time. AW

## 2015-11-04 NOTE — Telephone Encounter (Signed)
[facility] called states that pt needs refill on xanax .  pt claims that she moved and lost rx and the pharmacy states it was just filled on  10-21-15. do you want to give the ok for them to refill the rx early.

## 2016-02-24 ENCOUNTER — Encounter: Payer: Self-pay | Admitting: Psychiatry

## 2016-02-24 ENCOUNTER — Ambulatory Visit (INDEPENDENT_AMBULATORY_CARE_PROVIDER_SITE_OTHER): Payer: Medicaid Other | Admitting: Psychiatry

## 2016-02-24 VITALS — BP 138/76 | HR 101 | Temp 97.4°F | Ht 68.0 in | Wt 219.4 lb

## 2016-02-24 DIAGNOSIS — F331 Major depressive disorder, recurrent, moderate: Secondary | ICD-10-CM | POA: Diagnosis not present

## 2016-02-24 DIAGNOSIS — F4310 Post-traumatic stress disorder, unspecified: Secondary | ICD-10-CM

## 2016-02-24 MED ORDER — PRAZOSIN HCL 2 MG PO CAPS: 2.0000 mg | ORAL_CAPSULE | Freq: Every day | ORAL | Status: DC

## 2016-02-24 MED ORDER — BUPROPION HCL 75 MG PO TABS: 75.0000 mg | ORAL_TABLET | Freq: Every morning | ORAL | Status: DC

## 2016-02-24 MED ORDER — ZOLPIDEM TARTRATE 5 MG PO TABS: 5.0000 mg | ORAL_TABLET | Freq: Every evening | ORAL | Status: DC | PRN

## 2016-02-24 MED ORDER — QUETIAPINE FUMARATE 200 MG PO TABS: 200.0000 mg | ORAL_TABLET | Freq: Every day | ORAL | Status: DC

## 2016-02-24 MED ORDER — SERTRALINE HCL 100 MG PO TABS: 200.0000 mg | ORAL_TABLET | Freq: Every day | ORAL | Status: DC

## 2016-02-24 MED ORDER — ALPRAZOLAM 1 MG PO TABS: 1.0000 mg | ORAL_TABLET | Freq: Two times a day (BID) | ORAL | Status: DC | PRN

## 2016-02-24 NOTE — Progress Notes (Signed)
BH MD/PA/NP OP Progress Note  02/24/2016 2:12 PM Bailey [name]  MRN:  [id number]  Subjective:  Patient is a 30 year old female with history of PTSD and major depression who was following Dr. Mayford Knife. She reported that she has been having difficult time with her ex who is the father of her 107 year old son. She reported that she has been seeing peer Support from [facility] is trying to help with her PTSD symptoms. She reported that she continues to feel anxious and is unable to finish her task.  She reported that she has been taking medications as prescribed by Dr. Mayford Knife including alprazolam 2 mg by mouth twice a day. We discussed about her medications. She reported that she was prescribed prazosin only for a month and it was discontinued quickly and she is unable to determine the efficacy of the medication. She is interested in resuming the medication. She is also taking Ambien on a when necessary basis. She remained calm during the interview. She stated that she stays at home and does not have much activity. She wakes up in the morning with  difficulty as she has been prescribed Ambien Xanax and Seroquel 300 mg at bedtime.  Patient currently denied having any suicidal homicidal ideations or plans   Chief Complaint:  Chief Complaint    Follow-up; Medication Refill     Visit Diagnosis:     ICD-9-CM ICD-10-CM   1. PTSD (post-traumatic stress disorder) 309.81 F43.10   2. Major depressive disorder, recurrent episode, moderate (HCC) 296.32 F33.1     Past Medical History:  Past Medical History  Diagnosis Date  . Diabetes mellitus, type II (HCC)   . Chronic kidney disease   . PTSD (post-traumatic stress disorder)   . Depression   . Anxiety     Past Surgical History  Procedure Laterality Date  . Tonsillectomy and adenoidectomy  [id number]  . Breast reduction surgery  2004  . Gallbladder surgery  [id number]  . Cesarean section    . Breast surgery    . Tubal ligation    . Hernia repair      Family History:  Family History  Problem Relation Age of Onset  . Hypertension Mother   . Anxiety disorder Mother   . Depression Mother   . Anxiety disorder Father   . Depression Father   . Diabetes Maternal Aunt   . Diabetes Maternal Grandfather    Social History:  Social History   Social History  . Marital Status: Single    Spouse Name: N/A  . Number of Children: N/A  . Years of Education: N/A   Social History Main Topics  . Smoking status: Current Some Day Smoker -- 0.50 packs/day    Types: Cigarettes    Start date: 05/05/2003  . Smokeless tobacco: Never Used  . Alcohol Use: No     Comment: holiday  . Drug Use: No  . Sexual Activity: Yes    Birth Control/ Protection: IUD   Other Topics Concern  . None   Social History Narrative   Additional History:   Assessment:   Musculoskeletal: Strength & Muscle Tone: within normal limits Gait & Station: normal Patient leans: N/A  Psychiatric Specialty Exam: HPI  Review of Systems  Psychiatric/Behavioral: Negative for depression, suicidal ideas, hallucinations, memory loss and substance abuse. The patient is not nervous/anxious and does not have insomnia (previous complaints of insomnia has responded to Ambien).   All other systems reviewed and are negative.   Blood pressure 138/76, pulse 101,  temperature 97.4 F (36.3 C), temperature source Tympanic, height 5\' 8"  (1.727 m), weight 219 lb 6.4 oz (99.519 kg), SpO2 97 %.Body mass index is 33.37 kg/(m^2).  General Appearance: Well Groomed  Eye Contact:  Good  Speech:  Normal Rate  Volume:  Normal  Mood:  Anxious  Affect:  Congruent  Thought Process:  Linear and Logical  Orientation:  Full (Time, Place, and Person)  Thought Content:  Negative  Suicidal Thoughts:  No  Homicidal Thoughts:  No  Memory:  Immediate;   Good Recent;   Good Remote;   Good  Judgement:  Good  Insight:  Good  Psychomotor Activity:  Negative  Concentration:  Good  Recall:  Good   Fund of Knowledge: Good  Language: Good  Akathisia:  Negative  Handed:  Right unknown   AIMS (if indicated):  Not done today  Assets:  Communication Skills Desire for Improvement  ADL's:  Intact  Cognition: WNL  Sleep:  poor   Is the patient at risk to self?  No. Has the patient been a risk to self in the past 6 months?  No. Has the patient been a risk to self within the distant past?  Yes.   2012-2013 Is the patient a risk to others?  No. Has the patient been a risk to others in the past 6 months?  No. Has the patient been a risk to others within the distant past?  No.  Current Medications: Current Outpatient Prescriptions  Medication Sig Dispense Refill  . albuterol (PROAIR HFA) 108 (90 BASE) MCG/ACT inhaler Inhale into the lungs.    Marland Kitchen. alprazolam (XANAX) 2 MG tablet Take 1 tablet (2 mg total) by mouth 2 (two) times daily as needed for anxiety. 60 tablet 4  . buPROPion (WELLBUTRIN XL) 150 MG 24 hr tablet Take 1 tablet (150 mg total) by mouth daily. 30 tablet 4  . levonorgestrel (MIRENA) 20 MCG/24HR IUD by Intrauterine route.    Marland Kitchen. QUEtiapine (SEROQUEL) 200 MG tablet Take 1.5 tablets (300 mg total) by mouth at bedtime. 45 tablet 4  . sertraline (ZOLOFT) 100 MG tablet Take 2 tablets (200 mg total) by mouth daily. 60 tablet 4  . zolpidem (AMBIEN) 5 MG tablet Take 1 tablet (5 mg total) by mouth at bedtime as needed for sleep. 15 tablet 4   No current facility-administered medications for this visit.    Medical Decision Making:  Established Problem, Stable/Improving (1) and Review of New Medication or Change in Dosage (2)  Treatment Plan Summary:Medication management and Plan   Discussed with patient about her medications. She remains focused on filling out her disability papers. I will adjust her medications as follows Xanax 1 mg by mouth twice a day Sertraline 100 mg by mouth twice a day Wellbutrin 75 mg in the morning Seroquel 200 mg by mouth daily at bedtime Ambien 5 mg by  mouth daily at bedtime when necessary Prazosin 2 mg by mouth daily at bedtime  She was given 1 month supply of the medication  Follow up in 1 month or earlier depending on her symptoms   More than 50% of the time spent in psychoeducation, counseling and coordination of care.    This note was generated in part or whole with voice recognition software. Voice regonition is usually quite accurate but there are transcription errors that can and very often do occur. I apologize for any typographical errors that were not detected and corrected.     Brandy HaleUzma Caela Huot, MD  02/24/2016,  2:12 PM

## 2016-03-25 ENCOUNTER — Ambulatory Visit (INDEPENDENT_AMBULATORY_CARE_PROVIDER_SITE_OTHER): Payer: Self-pay | Admitting: Psychiatry

## 2017-07-29 ENCOUNTER — Inpatient Hospital Stay: Payer: Medicare Other | Attending: Oncology | Admitting: Oncology

## 2017-07-29 ENCOUNTER — Inpatient Hospital Stay: Payer: Medicare Other

## 2017-07-29 VITALS — BP 137/93 | HR 79 | Temp 97.8°F | Resp 18 | Wt 188.7 lb

## 2017-07-29 DIAGNOSIS — R634 Abnormal weight loss: Secondary | ICD-10-CM | POA: Diagnosis not present

## 2017-07-29 DIAGNOSIS — E1122 Type 2 diabetes mellitus with diabetic chronic kidney disease: Secondary | ICD-10-CM | POA: Diagnosis not present

## 2017-07-29 DIAGNOSIS — F17210 Nicotine dependence, cigarettes, uncomplicated: Secondary | ICD-10-CM

## 2017-07-29 DIAGNOSIS — F4310 Post-traumatic stress disorder, unspecified: Secondary | ICD-10-CM | POA: Diagnosis not present

## 2017-07-29 DIAGNOSIS — F329 Major depressive disorder, single episode, unspecified: Secondary | ICD-10-CM | POA: Insufficient documentation

## 2017-07-29 DIAGNOSIS — E538 Deficiency of other specified B group vitamins: Secondary | ICD-10-CM | POA: Insufficient documentation

## 2017-07-29 DIAGNOSIS — R238 Other skin changes: Secondary | ICD-10-CM

## 2017-07-29 DIAGNOSIS — D72829 Elevated white blood cell count, unspecified: Secondary | ICD-10-CM

## 2017-07-29 DIAGNOSIS — R233 Spontaneous ecchymoses: Secondary | ICD-10-CM | POA: Insufficient documentation

## 2017-07-29 DIAGNOSIS — Z79899 Other long term (current) drug therapy: Secondary | ICD-10-CM | POA: Insufficient documentation

## 2017-07-29 DIAGNOSIS — N189 Chronic kidney disease, unspecified: Secondary | ICD-10-CM | POA: Insufficient documentation

## 2017-07-29 LAB — COMPREHENSIVE METABOLIC PANEL
ALBUMIN: 4.5 g/dL (ref 3.5–5.0)
ALT: 16 U/L (ref 14–54)
ANION GAP: 9 (ref 5–15)
AST: 20 U/L (ref 15–41)
Alkaline Phosphatase: 55 U/L (ref 38–126)
BILIRUBIN TOTAL: 0.5 mg/dL (ref 0.3–1.2)
BUN: 11 mg/dL (ref 6–20)
CO2: 24 mmol/L (ref 22–32)
Calcium: 9.2 mg/dL (ref 8.9–10.3)
Chloride: 104 mmol/L (ref 101–111)
Creatinine, Ser: 0.95 mg/dL (ref 0.44–1.00)
GFR calc non Af Amer: >60 mL/min (ref >60)
GLUCOSE: 98 mg/dL (ref 65–99)
POTASSIUM: 3.3 mmol/L — AB (ref 3.5–5.1)
SODIUM: 137 mmol/L (ref 135–145)
TOTAL PROTEIN: 8 g/dL (ref 6.5–8.1)

## 2017-07-29 LAB — CBC WITH DIFFERENTIAL/PLATELET
BASOS PCT: 1 %
Basophils Absolute: 0.2 10*3/uL — ABNORMAL HIGH (ref 0–0.1)
EOS ABS: 0.1 10*3/uL (ref 0–0.7)
Eosinophils Relative: 1 %
HEMATOCRIT: 43.8 % (ref 35.0–47.0)
Hemoglobin: 14.9 g/dL (ref 12.0–16.0)
Lymphocytes Relative: 33 %
Lymphs Abs: 4.4 10*3/uL — ABNORMAL HIGH (ref 1.0–3.6)
MCH: 30.7 pg (ref 26.0–34.0)
MCHC: 34.1 g/dL (ref 32.0–36.0)
MCV: 89.9 fL (ref 80.0–100.0)
MONO ABS: 0.9 10*3/uL (ref 0.2–0.9)
MONOS PCT: 7 %
Neutro Abs: 7.6 10*3/uL — ABNORMAL HIGH (ref 1.4–6.5)
Neutrophils Relative %: 58 %
Platelets: 353 10*3/uL (ref 150–440)
RBC: 4.87 MIL/uL (ref 3.80–5.20)
RDW: 13.7 % (ref 11.5–14.5)
WBC: 13.2 10*3/uL — ABNORMAL HIGH (ref 3.6–11.0)

## 2017-07-29 LAB — LACTATE DEHYDROGENASE: LDH: 149 U/L (ref 98–192)

## 2017-07-29 LAB — PROTIME-INR
INR: 0.98
PROTHROMBIN TIME: 12.9 s (ref 11.4–15.2)

## 2017-07-29 LAB — FERRITIN: FERRITIN: 60 ng/mL (ref 11–307)

## 2017-07-29 LAB — IRON AND TIBC
IRON: 31 ug/dL (ref 28–170)
Saturation Ratios: 11 % (ref 10.4–31.8)
TIBC: 296 ug/dL (ref 250–450)
UIBC: 265 ug/dL

## 2017-07-29 LAB — APTT: aPTT: 32 seconds (ref 24–36)

## 2017-07-29 LAB — FOLATE: FOLATE: 10.4 ng/mL (ref >5.9)

## 2017-07-29 LAB — VITAMIN B12: Vitamin B-12: 166 pg/mL — ABNORMAL LOW (ref 180–914)

## 2017-07-29 NOTE — Progress Notes (Signed)
[facility]  [phone] [phone]  ID: [name] OB: 1986/03/31  MR#: [id number]  AOZ#:[id number]  Patient Care Team: Titus Mould, NP as PCP - General (Family Medicine)  CHIEF COMPLAINT: Leukocytosis, easy bruising.  INTERVAL HISTORY: Rolled female who states she has had persistent and repeated infections over the last 3 years and has been given multiple courses of antibiotics. She also has had a significant amount of weight loss over the past 3 years of nearly 200 pounds which has been unintentional. She also complains of easy bruising, but denies bleeding. She denies any fevers. She has no neurologic complaints. She has no chest pain or shortness of breath. She denies any nausea, vomiting, constipation, or diarrhea. She has no melena or hematochezia. She has no urinary complaints. Patient offers no further specific complaints today.  REVIEW OF SYSTEMS:   Review of Systems  Constitutional: Positive for weight loss. Negative for fever and malaise/fatigue.  Respiratory: Negative.  Negative for cough, hemoptysis and shortness of breath.   Cardiovascular: Negative.  Negative for chest pain and leg swelling.  Gastrointestinal: Negative.  Negative for abdominal pain, blood in stool and melena.  Genitourinary: Negative.  Negative for dysuria and hematuria.  Musculoskeletal: Negative.   Skin: Negative.  Negative for rash.  Neurological: Negative.  Negative for weakness.  Psychiatric/Behavioral: The patient is nervous/anxious.     As per HPI. Otherwise, a complete review of systems is negative.  PAST MEDICAL HISTORY: Past Medical History:  Diagnosis Date  . Anxiety   . Chronic kidney disease   . Depression   . Diabetes mellitus, type II (HCC)   . PTSD (post-traumatic stress disorder)     PAST SURGICAL HISTORY: Past Surgical History:  Procedure Laterality Date  . BREAST REDUCTION SURGERY  2004  . BREAST SURGERY    . CESAREAN  SECTION    . GALLBLADDER SURGERY  [id number]  . HERNIA REPAIR    . TONSILLECTOMY AND ADENOIDECTOMY  [id number]  . TUBAL LIGATION      FAMILY HISTORY: Family History  Problem Relation Age of Onset  . Hypertension Mother   . Anxiety disorder Mother   . Depression Mother   . Anxiety disorder Father   . Depression Father   . Diabetes Maternal Aunt   . Diabetes Maternal Grandfather     ADVANCED DIRECTIVES (Y/N):  N  HEALTH MAINTENANCE: Social History  Substance Use Topics  . Smoking status: Current Some Day Smoker    Packs/day: 0.50    Types: Cigarettes    Start date: 05/05/2003  . Smokeless tobacco: Never Used  . Alcohol use No     Comment: holiday     Colonoscopy:  PAP:  Bone density:  Lipid panel:  Allergies  Allergen Reactions  . Sumatriptan Rash  . Hydrocodone-Acetaminophen Nausea Only  . Sumatriptan Succinate Rash    Current Outpatient Prescriptions  Medication Sig Dispense Refill  . albuterol (PROAIR HFA) 108 (90 BASE) MCG/ACT inhaler Inhale into the lungs.    Marland Kitchen alprazolam (XANAX) 1 MG tablet Take 1 tablet (1 mg total) by mouth 2 (two) times daily as needed for anxiety. 60 tablet 0  . buPROPion (WELLBUTRIN) 75 MG tablet Take 1 tablet (75 mg total) by mouth every morning. 30 tablet 0  . cetirizine (ZYRTEC) 10 MG tablet Take 10 mg by mouth daily.    . clindamycin (CLEOCIN) 300 MG capsule Take by mouth.    . fluticasone (FLONASE) 50 MCG/ACT nasal spray Place into the nose.    Marland Kitchen  fluticasone (FLOVENT HFA) 110 MCG/ACT inhaler Inhale into the lungs.    Marland Kitchen levonorgestrel (MIRENA) 20 MCG/24HR IUD by Intrauterine route.    . prazosin (MINIPRESS) 2 MG capsule Take 1 capsule (2 mg total) by mouth at bedtime. 30 capsule 0  . QUEtiapine (SEROQUEL) 200 MG tablet Take 1 tablet (200 mg total) by mouth at bedtime. 30 tablet 0  . sertraline (ZOLOFT) 100 MG tablet Take 2 tablets (200 mg total) by mouth daily. 60 tablet 0  . zolpidem (AMBIEN) 5 MG tablet Take 1 tablet (5 mg total) by  mouth at bedtime as needed for sleep. 15 tablet 0   No current facility-administered medications for this visit.     OBJECTIVE: Vitals:   07/29/17 0941  BP: (!) 137/93  Pulse: 79  Resp: 18  Temp: 97.8 F (36.6 C)     Body mass index is 28.69 kg/m.    ECOG FS:0 - Asymptomatic  General: Well-developed, well-nourished, no acute distress. Eyes: Pink conjunctiva, anicteric sclera. HEENT: Normocephalic, moist mucous membranes, clear oropharnyx. Lungs: Clear to auscultation bilaterally. Heart: Regular rate and rhythm. No rubs, murmurs, or gallops. Abdomen: Soft, nontender, nondistended. No organomegaly noted, normoactive bowel sounds. Musculoskeletal: No edema, cyanosis, or clubbing. Neuro: Alert, answering all questions appropriately. Cranial nerves grossly intact. Skin: No rashes or petechiae noted. Psych: Normal affect. Lymphatics: No cervical, calvicular, axillary or inguinal LAD.   LAB RESULTS:  Lab Results  Component Value Date   NA 137 07/29/2017   K 3.3 (L) 07/29/2017   CL 104 07/29/2017   CO2 24 07/29/2017   GLUCOSE 98 07/29/2017   BUN 11 07/29/2017   CREATININE 0.95 07/29/2017   CALCIUM 9.2 07/29/2017   PROT 8.0 07/29/2017   ALBUMIN 4.5 07/29/2017   AST 20 07/29/2017   ALT 16 07/29/2017   ALKPHOS 55 07/29/2017   BILITOT 0.5 07/29/2017   GFRNONAA >60 07/29/2017   GFRAA >60 07/29/2017    Lab Results  Component Value Date   WBC 13.2 (H) 07/29/2017   NEUTROABS 7.6 (H) 07/29/2017   HGB 14.9 07/29/2017   HCT 43.8 07/29/2017   MCV 89.9 07/29/2017   PLT 353 07/29/2017     STUDIES: No results found.  ASSESSMENT: Leukocytosis, easy bruising.  PLAN:    1. Leukocytosis: Likely reactive secondary to underlying infections. Patient noted to have neutrophilia therefore BCR-ABL has been ordered and is pending at time of dictation. Flow cytometry is also pending. Patient will return to clinic in 2 weeks to discuss the results. 2. Easy bruising: PT, PTT, INR all  within normal limits. He has a normal platelet count. Von Willebrand's panel has been ordered for completeness. 3. Repeated infections: Unclear etiology. Flow cytometry as above. Have also ordered complement levels as well as HIV. Return to clinic as above. 4. Weight loss: Unclear etiology. Possibly secondary to repeated infections and treatment.  Approximately 60 minutes was spent in discussion of which greater than 50% was consultation.  Patient expressed understanding and was in agreement with this plan. She also understands that She can call clinic at any time with any questions, concerns, or complaints.   Cancer Staging No matching staging information was found for the patient.  Jeralyn Ruths, MD   07/29/2017 1:14 PM

## 2017-07-29 NOTE — Progress Notes (Signed)
Patient here today for initial evaluation regarding abnormal labs. Patient reports bruising, weight loss and hair loss.

## 2017-07-30 LAB — ANA W/REFLEX: Anti Nuclear Antibody(ANA): NEGATIVE

## 2017-07-30 LAB — THYROID PANEL WITH TSH
Free Thyroxine Index: 1.8 (ref 1.2–4.9)
T3 UPTAKE RATIO: 25 % (ref 24–39)
T4 TOTAL: 7.2 ug/dL (ref 4.5–12.0)
TSH: 2.29 u[IU]/mL (ref 0.450–4.500)

## 2017-07-31 LAB — IGG, IGA, IGM
IGA: 317 mg/dL (ref 87–352)
IGG (IMMUNOGLOBIN G), SERUM: 1105 mg/dL (ref 700–1600)
IgM (Immunoglobulin M), Srm: 106 mg/dL (ref 26–217)

## 2017-07-31 LAB — C3 AND C4
C3 COMPLEMENT: 129 mg/dL (ref 82–167)
Complement C4, Body Fluid: 23 mg/dL (ref 14–44)

## 2017-08-01 LAB — IGE: IgE (Immunoglobulin E), Serum: 152 IU/mL — ABNORMAL HIGH (ref 0–100)

## 2017-08-02 LAB — COMP PANEL: LEUKEMIA/LYMPHOMA

## 2017-08-04 LAB — VON WILLEBRAND PANEL
COAGULATION FACTOR VIII: 67 % (ref 57–163)
RISTOCETIN CO-FACTOR, PLASMA: 51 % (ref 50–200)
Von Willebrand Antigen, Plasma: 62 % (ref 50–200)

## 2017-08-04 LAB — BCR-ABL1, CML/ALL, PCR, QUANT

## 2017-08-04 LAB — COAG STUDIES INTERP REPORT

## 2017-08-10 DIAGNOSIS — D72829 Elevated white blood cell count, unspecified: Secondary | ICD-10-CM | POA: Insufficient documentation

## 2017-08-10 NOTE — Progress Notes (Signed)
[facility]  [phone] Fax:(336) [phone]  ID: Bailey [name] OB: Mar 30, 1986  MR#: [id number]  KCL#:[id number]  Patient Care Team: Ricardo Jericho, NP as PCP - General (Family Medicine)  CHIEF COMPLAINT: Leukocytosis, easy bruising.  INTERVAL HISTORY: Patient returns to clinic today for further evaluation and discussion of her laboratory work.  She continues to be anxious, but otherwise feels well. She denies any fevers. She has no neurologic complaints. She has no chest pain or shortness of breath. She denies any nausea, vomiting, constipation, or diarrhea. She has no melena or hematochezia. She has no urinary complaints. Patient offers no further specific complaints today.  REVIEW OF SYSTEMS:   Review of Systems  Constitutional: Positive for weight loss. Negative for fever and malaise/fatigue.  Respiratory: Negative.  Negative for cough, hemoptysis and shortness of breath.   Cardiovascular: Negative.  Negative for chest pain and leg swelling.  Gastrointestinal: Negative.  Negative for abdominal pain, blood in stool and melena.  Genitourinary: Negative.  Negative for dysuria and hematuria.  Musculoskeletal: Negative.   Skin: Negative.  Negative for rash.  Neurological: Negative.  Negative for weakness.  Psychiatric/Behavioral: The patient is nervous/anxious.     As per HPI. Otherwise, a complete review of systems is negative.  PAST MEDICAL HISTORY: Past Medical History:  Diagnosis Date  . Anxiety   . Chronic kidney disease   . Depression   . Diabetes mellitus, type II ([place])   . PTSD (post-traumatic stress disorder)     PAST SURGICAL HISTORY: Past Surgical History:  Procedure Laterality Date  . BREAST REDUCTION SURGERY  2004  . BREAST SURGERY    . CESAREAN SECTION    . GALLBLADDER SURGERY  [id number]  . HERNIA REPAIR    . TONSILLECTOMY AND ADENOIDECTOMY  [id number]  . TUBAL LIGATION      FAMILY HISTORY: Family History  Problem  Relation Age of Onset  . Hypertension Mother   . Anxiety disorder Mother   . Depression Mother   . Anxiety disorder Father   . Depression Father   . Diabetes Maternal Aunt   . Diabetes Maternal Grandfather     ADVANCED DIRECTIVES (Y/N):  N  HEALTH MAINTENANCE: Social History  Substance Use Topics  . Smoking status: Current Some Day Smoker    Packs/day: 0.50    Types: Cigarettes    Start date: 05/05/2003  . Smokeless tobacco: Never Used  . Alcohol use No     Comment: holiday     Colonoscopy:  PAP:  Bone density:  Lipid panel:  Allergies  Allergen Reactions  . Sumatriptan Rash  . Hydrocodone-Acetaminophen Nausea Only  . Sumatriptan Succinate Rash    Current Outpatient Prescriptions  Medication Sig Dispense Refill  . albuterol (PROAIR HFA) 108 (90 BASE) MCG/ACT inhaler Inhale into the lungs.    Marland Kitchen alprazolam (XANAX) 1 MG tablet Take 1 tablet (1 mg total) by mouth 2 (two) times daily as needed for anxiety. 60 tablet 0  . buPROPion (WELLBUTRIN) 75 MG tablet Take 1 tablet (75 mg total) by mouth every morning. 30 tablet 0  . cetirizine (ZYRTEC) 10 MG tablet Take 10 mg by mouth daily.    . fluticasone (FLONASE) 50 MCG/ACT nasal spray Place into the nose.    . fluticasone (FLOVENT HFA) 110 MCG/ACT inhaler Inhale into the lungs.    Marland Kitchen levonorgestrel (MIRENA) 20 MCG/24HR IUD by Intrauterine route.    . prazosin (MINIPRESS) 2 MG capsule Take 1 capsule (2 mg total) by mouth at  bedtime. 30 capsule 0  . QUEtiapine (SEROQUEL) 200 MG tablet Take 1 tablet (200 mg total) by mouth at bedtime. 30 tablet 0  . sertraline (ZOLOFT) 100 MG tablet Take 2 tablets (200 mg total) by mouth daily. 60 tablet 0  . zolpidem (AMBIEN) 5 MG tablet Take 1 tablet (5 mg total) by mouth at bedtime as needed for sleep. 15 tablet 0   No current facility-administered medications for this visit.     OBJECTIVE: Vitals:   08/12/17 0910  BP: (!) 153/95  Pulse: 73  Resp: 20  Temp: (!) 97.5 F (36.4 C)      Body mass index is 28.79 kg/m.    ECOG FS:0 - Asymptomatic  General: Well-developed, well-nourished, no acute distress. Eyes: Pink conjunctiva, anicteric sclera. HEENT: Normocephalic, moist mucous membranes, clear oropharnyx. Lungs: Clear to auscultation bilaterally. Heart: Regular rate and rhythm. No rubs, murmurs, or gallops. Abdomen: Soft, nontender, nondistended. No organomegaly noted, normoactive bowel sounds. Musculoskeletal: No edema, cyanosis, or clubbing. Neuro: Alert, answering all questions appropriately. Cranial nerves grossly intact. Skin: No rashes or petechiae noted. Psych: Normal affect. Lymphatics: No cervical, calvicular, axillary or inguinal LAD.   LAB RESULTS:  Lab Results  Component Value Date   NA 137 07/29/2017   K 3.3 (L) 07/29/2017   CL 104 07/29/2017   CO2 24 07/29/2017   GLUCOSE 98 07/29/2017   BUN 11 07/29/2017   CREATININE 0.95 07/29/2017   CALCIUM 9.2 07/29/2017   PROT 8.0 07/29/2017   ALBUMIN 4.5 07/29/2017   AST 20 07/29/2017   ALT 16 07/29/2017   ALKPHOS 55 07/29/2017   BILITOT 0.5 07/29/2017   GFRNONAA >60 07/29/2017   GFRAA >60 07/29/2017    Lab Results  Component Value Date   WBC 13.2 (H) 07/29/2017   NEUTROABS 7.6 (H) 07/29/2017   HGB 14.9 07/29/2017   HCT 43.8 07/29/2017   MCV 89.9 07/29/2017   PLT 353 07/29/2017     STUDIES: No results found.  ASSESSMENT: Leukocytosis, easy bruising.  PLAN:    1. Leukocytosis: Patient's white blood count remains persistently elevated, but this is most likely reactive given her underlying infections.  BCR-ABL and peripheral blood flow cytometry are negative.  No intervention is needed at this time.  The patient does not require a bone marrow biopsy.  2. Easy bruising: PT, PTT, INR all within normal limits. She has a normal platelet count. Von Willebrand's panel is within normal limits. 3. Repeated infections: Unclear etiology.  Laboratory work as above.  Complement levels, IgG, IgA, IgM  are all within normal limits.  IgE is mildly elevated possibly suggesting an immune reaction.  HIV is pending at time of dictation.  4. Weight loss: Unclear etiology. Possibly secondary to repeated infections and treatment. 5.  B12 deficiency: Patient had a mildly decreased B12 level, otherwise all of her other laboratory work was either negative or within normal limits.  She is not anemic therefore recommended patient initiate oral B12 supplementation. 6.  Disposition: After lengthy discussion with the patient, it was determined that no further follow-up is necessary.  Continue follow-up and monitoring per primary care.  Approximately 30 minutes was spent in discussion of which greater than 50% was consultation.  Patient expressed understanding and was in agreement with this plan. She also understands that She can call clinic at any time with any questions, concerns, or complaints.    Lloyd Huger, MD   08/12/2017 1:18 PM

## 2017-08-11 ENCOUNTER — Encounter: Payer: Self-pay | Admitting: Family Medicine

## 2017-08-12 ENCOUNTER — Inpatient Hospital Stay (HOSPITAL_BASED_OUTPATIENT_CLINIC_OR_DEPARTMENT_OTHER): Payer: Medicare Other | Admitting: Oncology

## 2017-08-12 VITALS — BP 153/95 | HR 73 | Temp 97.5°F | Resp 20 | Wt 189.4 lb

## 2017-08-12 DIAGNOSIS — E538 Deficiency of other specified B group vitamins: Secondary | ICD-10-CM | POA: Diagnosis not present

## 2017-08-12 DIAGNOSIS — R634 Abnormal weight loss: Secondary | ICD-10-CM | POA: Diagnosis not present

## 2017-08-12 DIAGNOSIS — F17210 Nicotine dependence, cigarettes, uncomplicated: Secondary | ICD-10-CM

## 2017-08-12 DIAGNOSIS — D72829 Elevated white blood cell count, unspecified: Secondary | ICD-10-CM

## 2017-08-12 DIAGNOSIS — Z79899 Other long term (current) drug therapy: Secondary | ICD-10-CM | POA: Diagnosis not present

## 2017-08-12 DIAGNOSIS — R233 Spontaneous ecchymoses: Secondary | ICD-10-CM

## 2017-08-12 DIAGNOSIS — R238 Other skin changes: Secondary | ICD-10-CM

## 2017-08-12 NOTE — Progress Notes (Signed)
Patient denies any concerns today.  

## 2018-02-07 ENCOUNTER — Other Ambulatory Visit: Payer: Self-pay

## 2018-02-07 ENCOUNTER — Ambulatory Visit
Admission: EM | Admit: 2018-02-07 | Discharge: 2018-02-07 | Disposition: A | Discharge status: Home / Self Care | Payer: Medicare Other | Attending: Family Medicine | Admitting: Family Medicine

## 2018-02-07 DIAGNOSIS — J0100 Acute maxillary sinusitis, unspecified: Secondary | ICD-10-CM | POA: Diagnosis not present

## 2018-02-07 DIAGNOSIS — F17210 Nicotine dependence, cigarettes, uncomplicated: Secondary | ICD-10-CM | POA: Diagnosis not present

## 2018-02-07 MED ORDER — AMOXICILLIN-POT CLAVULANATE 875-125 MG PO TABS: 1.0000 | ORAL_TABLET | Freq: Two times a day (BID) | ORAL | 0 refills | Status: DC

## 2018-02-07 NOTE — ED Triage Notes (Signed)
Pt with left sided facial pressure and pain, blowing green and bloody drainage from her nose.

## 2018-02-07 NOTE — ED Provider Notes (Signed)
[facility]  CSN: [id number] Arrival date & time: 02/07/18  1043  History   Chief Complaint Chief Complaint  Patient presents with  . Facial Pain   HPI  32 year old female presents with sinus pressure and pain.  Patient states that she is been sick for the past 2 weeks.  Worsening over the past 4 days.  She reports left-sided facial pain and pressure.  Congestion & cough as well.  Reported fever.  Symptoms are severe.  She reports no improvement with Flonase and antihistamines.  No known exacerbating factors.  No other complaints.  Past Medical History:  Diagnosis Date  . Anxiety   . Chronic kidney disease   . Depression   . Diabetes mellitus, type II (HCC)   . PTSD (post-traumatic stress disorder)     Patient Active Problem List   Diagnosis Date Noted  . Leukocytosis 08/10/2017  . Easy bruising 07/29/2017  . Hernia of anterior abdominal wall 08/22/2015  . Neurosis, posttraumatic 07/29/2015  . 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 History of migraine headaches 07/29/2015  . Depression, major, recurrent, moderate (HCC) 07/29/2015  . Insomnia, persistent 07/29/2015  . H/O disease 07/29/2015  . Diabetes mellitus arising in pregnancy 07/29/2015  . Anxiety, generalized 07/29/2015  . History of cervical cancer 07/29/2015  . Borderline personality disorder (HCC) 07/29/2015  . Bipolar affective disorder (HCC) 07/29/2015  . Asthma without status asthmaticus 05/05/2015  . Frequent UTI 12/04/2014  . Cervical pain 09/18/2014  . Amnesia 09/18/2014  . Chronic tension-type headache, intractable 09/18/2014  . Disordered sleep 06/06/2014  . Anxiety 05/09/2014  . Has a tremor 04/25/2014  . Compulsive tobacco user syndrome 04/25/2014  . Speech problem 04/25/2014  . Numbness and tingling 04/25/2014  . Cephalalgia 04/25/2014  . Difficulty in walking 04/25/2014  . Clinical depression 04/25/2014  . Blurred vision 04/25/2014  . Current tobacco use 04/25/2014  .  Affective bipolar disorder (HCC) 03/08/2014  . Breath shortness 03/04/2013  . Airway hyperreactivity 03/04/2013    Past Surgical History:  Procedure Laterality Date  . BREAST REDUCTION SURGERY  2004  . BREAST SURGERY    . CESAREAN SECTION    . CESAREAN SECTION    . GALLBLADDER SURGERY  [id number]  . HERNIA REPAIR    . TONSILLECTOMY    . TONSILLECTOMY AND ADENOIDECTOMY  [id number]  . TUBAL LIGATION      OB History   None      Home Medications    Prior to Admission medications   Medication Sig Start Date End Date Taking? Authorizing Provider  albuterol (PROAIR HFA) 108 (90 BASE) MCG/ACT inhaler Inhale into the lungs. 03/04/13   [provider]  alprazolam Prudy Feeler(XANAX) 1 MG tablet Take 1 tablet (1 mg total) by mouth 2 (two) times daily as needed for anxiety. 02/24/16   Brandy HaleFaheem, Uzma, MD  amoxicillin-clavulanate (AUGMENTIN) 875-125 MG tablet Take 1 tablet by mouth every 12 (twelve) hours. 02/07/18   Tommie Samsook, Rayvin Abid G, DO  buPROPion (WELLBUTRIN) 75 MG tablet Take 1 tablet (75 mg total) by mouth every morning. 02/24/16   Brandy HaleFaheem, Uzma, MD  cetirizine (ZYRTEC) 10 MG tablet Take 10 mg by mouth daily.    [provider]  fluticasone (FLONASE) 50 MCG/ACT nasal spray Place into the nose. 04/29/17 04/29/18  [provider]  fluticasone (FLOVENT HFA) 110 MCG/ACT inhaler Inhale into the lungs. 07/18/17 07/18/18  [provider]  levonorgestrel (MIRENA) 20 MCG/24HR IUD by Intrauterine route.    [provider]  prazosin (MINIPRESS) 2 MG capsule Take 1 capsule (2 mg total) by mouth at bedtime. 02/24/16   Brandy Hale, MD  QUEtiapine (SEROQUEL) 200 MG tablet Take 1 tablet (200 mg total) by mouth at bedtime. 02/24/16   Brandy Hale, MD  sertraline (ZOLOFT) 100 MG tablet Take 2 tablets (200 mg total) by mouth daily. 02/24/16   Brandy Hale, MD  zolpidem (AMBIEN) 5 MG tablet Take 1 tablet (5 mg total) by mouth at bedtime as needed for sleep. 02/24/16   Brandy Hale, MD    Family  History Family History  Problem Relation Age of Onset  . Hypertension Mother   . Anxiety disorder Mother   . Depression Mother   . Anxiety disorder Father   . Depression Father   . Diabetes Maternal Aunt   . Diabetes Maternal Grandfather     Social History Social History   Tobacco Use  . Smoking status: Current Some Day Smoker    Packs/day: 0.50    Types: Cigarettes    Start date: 05/05/2003  . Smokeless tobacco: Never Used  Substance Use Topics  . Alcohol use: Yes    Alcohol/week: 0.0 oz    Comment: rare  . Drug use: No     Allergies   Sumatriptan; Hydrocodone-acetaminophen; and Sumatriptan succinate   Review of Systems Review of Systems  Constitutional: Positive for fever.  HENT: Positive for congestion, sinus pressure and sinus pain.   Respiratory: Positive for cough.    Physical Exam Triage Vital Signs ED Triage Vitals  Enc Vitals Group     BP 02/07/18 1054 116/81     Pulse Rate 02/07/18 1054 76     Resp 02/07/18 1054 18     Temp 02/07/18 1054 97.8 F (36.6 C)     Temp Source 02/07/18 1054 Oral     SpO2 02/07/18 1054 100 %     Weight 02/07/18 1055 192 lb (87.1 kg)     Height 02/07/18 1055 5\' 7"  (1.702 m)     Head Circumference --      Peak Flow --      Pain Score 02/07/18 1055 8     Pain Loc --      Pain Edu? --      Excl. in GC? --    Updated Vital Signs BP 116/81 (BP Location: Left Arm)   Pulse 76   Temp 97.8 F (36.6 C) (Oral)   Resp 18   Ht 5\' 7"  (1.702 m)   Wt 192 lb (87.1 kg)   SpO2 100%   BMI 30.07 kg/m   Physical Exam  Constitutional: She is oriented to person, place, and time. She appears well-developed. No distress.  HENT:  Head: Normocephalic and atraumatic.  Maxillary sinus tenderness to palpation (left).  Cardiovascular: Normal rate and regular rhythm.  Pulmonary/Chest: Effort normal and breath sounds normal. She has no wheezes. She has no rales.  Neurological: She is alert and oriented to person, place, and time.    Psychiatric: She has a normal mood and affect. Her behavior is normal.  Nursing note and vitals reviewed.  UC Treatments / Results  Labs (all labs ordered are listed, but only abnormal results are displayed) Labs Reviewed - No data to display  EKG None Radiology No results found.  Procedures Procedures (including critical care time)  Medications Ordered in UC Medications - No data to display   Initial Impression / Assessment and Plan / UC Course  I have reviewed the triage vital signs and  the nursing notes.  Pertinent labs & imaging results that were available during my care of the patient were reviewed by me and considered in my medical decision making (see chart for details).    32 year old female presents with sinusitis. Treating with Augmentin.   Final Clinical Impressions(s) / UC Diagnoses   Final diagnoses:  Acute maxillary sinusitis, recurrence not specified    ED Discharge Orders        Ordered    amoxicillin-clavulanate (AUGMENTIN) 875-125 MG tablet  Every 12 hours     02/07/18 1130     Controlled Substance Prescriptions [place] Controlled Substance Registry consulted? Not Applicable   Tommie Sams, DO 02/07/18 1238

## 2018-05-08 ENCOUNTER — Ambulatory Visit
Admission: EM | Admit: 2018-05-08 | Discharge: 2018-05-08 | Disposition: A | Discharge status: Home / Self Care | Payer: Medicare Other | Attending: Family Medicine | Admitting: Family Medicine

## 2018-05-08 ENCOUNTER — Other Ambulatory Visit: Payer: Self-pay

## 2018-05-08 ENCOUNTER — Ambulatory Visit: Payer: Medicare Other

## 2018-05-08 ENCOUNTER — Encounter: Payer: Self-pay | Admitting: Gynecology

## 2018-05-08 DIAGNOSIS — N189 Chronic kidney disease, unspecified: Secondary | ICD-10-CM | POA: Insufficient documentation

## 2018-05-08 DIAGNOSIS — S62502A Fracture of unspecified phalanx of left thumb, initial encounter for closed fracture: Secondary | ICD-10-CM | POA: Diagnosis not present

## 2018-05-08 DIAGNOSIS — F603 Borderline personality disorder: Secondary | ICD-10-CM | POA: Diagnosis not present

## 2018-05-08 DIAGNOSIS — J45909 Unspecified asthma, uncomplicated: Secondary | ICD-10-CM | POA: Diagnosis not present

## 2018-05-08 DIAGNOSIS — M542 Cervicalgia: Secondary | ICD-10-CM | POA: Diagnosis not present

## 2018-05-08 DIAGNOSIS — F4310 Post-traumatic stress disorder, unspecified: Secondary | ICD-10-CM | POA: Diagnosis not present

## 2018-05-08 DIAGNOSIS — F319 Bipolar disorder, unspecified: Secondary | ICD-10-CM | POA: Insufficient documentation

## 2018-05-08 DIAGNOSIS — G43909 Migraine, unspecified, not intractable, without status migrainosus: Secondary | ICD-10-CM | POA: Diagnosis not present

## 2018-05-08 DIAGNOSIS — R200 Anesthesia of skin: Secondary | ICD-10-CM | POA: Diagnosis not present

## 2018-05-08 DIAGNOSIS — Z79899 Other long term (current) drug therapy: Secondary | ICD-10-CM | POA: Insufficient documentation

## 2018-05-08 DIAGNOSIS — Z8541 Personal history of malignant neoplasm of cervix uteri: Secondary | ICD-10-CM | POA: Insufficient documentation

## 2018-05-08 DIAGNOSIS — G4700 Insomnia, unspecified: Secondary | ICD-10-CM | POA: Insufficient documentation

## 2018-05-08 DIAGNOSIS — Z8249 Family history of ischemic heart disease and other diseases of the circulatory system: Secondary | ICD-10-CM | POA: Diagnosis not present

## 2018-05-08 DIAGNOSIS — M7989 Other specified soft tissue disorders: Secondary | ICD-10-CM | POA: Insufficient documentation

## 2018-05-08 DIAGNOSIS — Z87440 Personal history of urinary (tract) infections: Secondary | ICD-10-CM | POA: Diagnosis not present

## 2018-05-08 DIAGNOSIS — M79645 Pain in left finger(s): Secondary | ICD-10-CM | POA: Insufficient documentation

## 2018-05-08 DIAGNOSIS — O24419 Gestational diabetes mellitus in pregnancy, unspecified control: Secondary | ICD-10-CM | POA: Diagnosis not present

## 2018-05-08 DIAGNOSIS — F17210 Nicotine dependence, cigarettes, uncomplicated: Secondary | ICD-10-CM | POA: Insufficient documentation

## 2018-05-08 MED ORDER — HYDROCODONE-ACETAMINOPHEN 5-325 MG PO TABS: ORAL_TABLET | ORAL | 0 refills | Status: DC

## 2018-05-08 NOTE — ED Triage Notes (Signed)
Patient c/o last night with left thumb discomfort. Pt. Stated pulled on her left thumb and pop it.. Patient present today with left thumb pain  / swelling.

## 2018-05-08 NOTE — Discharge Instructions (Signed)
Follow with orthopedist this week

## 2018-05-08 NOTE — ED Provider Notes (Signed)
[facility]    CSN: [id number] Arrival date & time: 05/08/18  1056     History   Chief Complaint Chief Complaint  Patient presents with  . Hand Pain    HPI VELVIE THOMASTON is a 32 y.o. female.   32 yo female with a c/o left thumb pain since last night after pulling it "to pop it". This morning woke up with pain and swelling to the thumb.   The history is provided by the patient.  Hand Pain     Past Medical History:  Diagnosis Date  . Anxiety   . Chronic kidney disease   . Depression   . Diabetes mellitus, type II (HCC)   . PTSD (post-traumatic stress disorder)     Patient Active Problem List   Diagnosis Date Noted  . Leukocytosis 08/10/2017  . Easy bruising 07/29/2017  . Hernia of anterior abdominal wall 08/22/2015  . Neurosis, posttraumatic 07/29/2015  . 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 History of migraine headaches 07/29/2015  . Depression, major, recurrent, moderate (HCC) 07/29/2015  . Insomnia, persistent 07/29/2015  . H/O disease 07/29/2015  . Diabetes mellitus arising in pregnancy 07/29/2015  . Anxiety, generalized 07/29/2015  . History of cervical cancer 07/29/2015  . Borderline personality disorder (HCC) 07/29/2015  . Bipolar affective disorder (HCC) 07/29/2015  . Asthma without status asthmaticus 05/05/2015  . Frequent UTI 12/04/2014  . Cervical pain 09/18/2014  . Amnesia 09/18/2014  . Chronic tension-type headache, intractable 09/18/2014  . Disordered sleep 06/06/2014  . Anxiety 05/09/2014  . Has a tremor 04/25/2014  . Compulsive tobacco user syndrome 04/25/2014  . Speech problem 04/25/2014  . Numbness and tingling 04/25/2014  . Cephalalgia 04/25/2014  . Difficulty in walking 04/25/2014  . Clinical depression 04/25/2014  . Blurred vision 04/25/2014  . Current tobacco use 04/25/2014  . Affective bipolar disorder (HCC) 03/08/2014  . Breath shortness 03/04/2013  . Airway hyperreactivity 03/04/2013    Past  Surgical History:  Procedure Laterality Date  . BREAST REDUCTION SURGERY  2004  . BREAST SURGERY    . CESAREAN SECTION    . CESAREAN SECTION    . GALLBLADDER SURGERY  [id number]  . HERNIA REPAIR    . TONSILLECTOMY    . TONSILLECTOMY AND ADENOIDECTOMY  [id number]  . TUBAL LIGATION      OB History   None      Home Medications    Prior to Admission medications   Medication Sig Start Date End Date Taking? Authorizing Provider  albuterol (PROAIR HFA) 108 (90 BASE) MCG/ACT inhaler Inhale into the lungs. 03/04/13  Yes [provider]  alprazolam Prudy Feeler) 1 MG tablet Take 1 tablet (1 mg total) by mouth 2 (two) times daily as needed for anxiety. 02/24/16  Yes Brandy Hale, MD  buPROPion (WELLBUTRIN) 75 MG tablet Take 1 tablet (75 mg total) by mouth every morning. 02/24/16  Yes Brandy Hale, MD  cetirizine (ZYRTEC) 10 MG tablet Take 10 mg by mouth daily.   Yes [provider]  fluticasone (FLOVENT HFA) 110 MCG/ACT inhaler Inhale into the lungs. 07/18/17 07/18/18 Yes [provider]  levonorgestrel (MIRENA) 20 MCG/24HR IUD by Intrauterine route.   Yes [provider]  prazosin (MINIPRESS) 2 MG capsule Take 1 capsule (2 mg total) by mouth at bedtime. 02/24/16  Yes Brandy Hale, MD  QUEtiapine (SEROQUEL) 200 MG tablet Take 1 tablet (200 mg total) by mouth at bedtime. 02/24/16  Yes Brandy Hale, MD  sertraline (ZOLOFT) 100 MG tablet  Take 2 tablets (200 mg total) by mouth daily. 02/24/16  Yes Brandy Hale, MD  zolpidem (AMBIEN) 5 MG tablet Take 1 tablet (5 mg total) by mouth at bedtime as needed for sleep. 02/24/16  Yes Brandy Hale, MD  amoxicillin-clavulanate (AUGMENTIN) 875-125 MG tablet Take 1 tablet by mouth every 12 (twelve) hours. 02/07/18   Tommie Sams, DO  fluticasone (FLONASE) 50 MCG/ACT nasal spray Place into the nose. 04/29/17 04/29/18  [provider]  HYDROcodone-acetaminophen (NORCO/VICODIN) 5-325 MG tablet 1-2 tabs po q 8 hours prn 05/08/18   Payton Mccallum,  MD    Family History Family History  Problem Relation Age of Onset  . Hypertension Mother   . Anxiety disorder Mother   . Depression Mother   . Anxiety disorder Father   . Depression Father   . Diabetes Maternal Aunt   . Diabetes Maternal Grandfather     Social History Social History   Tobacco Use  . Smoking status: Current Some Day Smoker    Packs/day: 0.50    Types: Cigarettes    Start date: 05/05/2003  . Smokeless tobacco: Never Used  Substance Use Topics  . Alcohol use: Yes    Alcohol/week: 0.0 oz    Comment: rare  . Drug use: No     Allergies   Sumatriptan; Hydrocodone-acetaminophen; and Sumatriptan succinate   Review of Systems Review of Systems   Physical Exam Triage Vital Signs ED Triage Vitals  Enc Vitals Group     BP 05/08/18 1123 130/86     Pulse Rate 05/08/18 1123 89     Resp 05/08/18 1123 16     Temp 05/08/18 1123 98.1 F (36.7 C)     Temp Source 05/08/18 1123 Oral     SpO2 05/08/18 1123 98 %     Weight 05/08/18 1119 193 lb (87.5 kg)     Height 05/08/18 1119 5\' 8"  (1.727 m)     Head Circumference --      Peak Flow --      Pain Score 05/08/18 1119 9     Pain Loc --      Pain Edu? --      Excl. in GC? --    No data found.  Updated Vital Signs BP 130/86 (BP Location: Right Arm)   Pulse 89   Temp 98.1 F (36.7 C) (Oral)   Resp 16   Ht 5\' 8"  (1.727 m)   Wt 193 lb (87.5 kg)   SpO2 98%   BMI 29.35 kg/m   Visual Acuity Right Eye Distance:   Left Eye Distance:   Bilateral Distance:    Right Eye Near:   Left Eye Near:    Bilateral Near:     Physical Exam  Constitutional: She appears well-developed and well-nourished. No distress.  Musculoskeletal:       Left hand: She exhibits tenderness, bony tenderness (over 1st mcp joint (thumb)) and swelling. She exhibits normal two-point discrimination, normal capillary refill and no laceration. Normal sensation noted. Normal strength noted.  Skin: She is not diaphoretic.  Nursing note and  vitals reviewed.    UC Treatments / Results  Labs (all labs ordered are listed, but only abnormal results are displayed) Labs Reviewed - No data to display  EKG None  Radiology Dg Finger Thumb Left  Result Date: 05/08/2018 CLINICAL DATA:  Acute thumb pain following injury yesterday. Initial encounter. EXAM: LEFT THUMB 2+V COMPARISON:  None. FINDINGS: Three small faint ill-defined densities along the MEDIAL aspect of  the 1st MCP joint noted and may represent small avulsion fracture/injury. No other fracture, subluxation or dislocation identified. No focal bony lesions are present. IMPRESSION: Three small faint ill-defined densities along the 1st MCP joint - question avulsion fracture/injury. Correlate with pain. Electronically Signed   By: Harmon PierJeffrey  Hu M.D.   On: 05/08/2018 11:51    Procedures Procedures (including critical care time)  Medications Ordered in UC Medications - No data to display  Initial Impression / Assessment and Plan / UC Course  I have reviewed the triage vital signs and the nursing notes.  Pertinent labs & imaging results that were available during my care of the patient were reviewed by me and considered in my medical decision making (see chart for details).      Final Clinical Impressions(s) / UC Diagnoses   Final diagnoses:  Closed avulsion fracture of phalanx of left thumb, initial encounter     Discharge Instructions     Follow with orthopedist this week    ED Prescriptions    Medication Sig Dispense Auth. Provider   HYDROcodone-acetaminophen (NORCO/VICODIN) 5-325 MG tablet 1-2 tabs po q 8 hours prn 6 tablet Payton Mccallumonty, Kimbley Sprague, MD      1. x-ray results and diagnosis reviewed with patient 2. Immobilized with thumb spica splint 3.  rx as per orders above; reviewed possible side effects, interactions, risks and benefits  3. Follow-up with orthopedist this week  Controlled Substance Prescriptions [place] Controlled Substance Registry consulted? Not  Applicable   Payton Mccallumonty, Shayaan Parke, MD 05/08/18 1750

## 2018-08-21 ENCOUNTER — Other Ambulatory Visit: Payer: Self-pay

## 2018-08-21 ENCOUNTER — Encounter
Admission: RE | Admit: 2018-08-21 | Discharge: 2018-08-21 | Disposition: A | Discharge status: Home / Self Care | Payer: Medicare Other | Source: Ambulatory Visit | Attending: Obstetrics & Gynecology | Admitting: Obstetrics & Gynecology

## 2018-08-21 DIAGNOSIS — Z01812 Encounter for preprocedural laboratory examination: Secondary | ICD-10-CM | POA: Diagnosis not present

## 2018-08-21 HISTORY — DX: Unspecified asthma, uncomplicated: J45.909

## 2018-08-21 HISTORY — DX: Cough: R05

## 2018-08-21 HISTORY — DX: Cough, unspecified: R05.9

## 2018-08-21 HISTORY — DX: Bipolar disorder, unspecified: F31.9

## 2018-08-21 HISTORY — DX: Headache, unspecified: R51.9

## 2018-08-21 HISTORY — DX: Headache: R51

## 2018-08-21 HISTORY — DX: Anemia, unspecified: D64.9

## 2018-08-21 LAB — TYPE AND SCREEN
ABO/RH(D): O POS
ANTIBODY SCREEN: NEGATIVE

## 2018-08-21 NOTE — Patient Instructions (Signed)
Your procedure is scheduled on: 08/28/18 Report to Day Surgery. [facility]OND FLOOR To find out your arrival time please call [phone] between 1PM - 3PM on 08/25/18  Remember: Instructions that are not followed completely may result in serious medical risk,  up to and including death, or upon the discretion of your surgeon and anesthesiologist your  surgery may need to be rescheduled.     _X__ 1. Do not eat food after midnight the night before your procedure.                 No gum chewing or hard candies. You may drink clear liquids up to 2 hours                 before you are scheduled to arrive for your surgery- DO not drink clear                 liquids within 2 hours of the start of your surgery.                 Clear Liquids include:  water, apple juice without pulp, clear carbohydrate                 drink such as Clearfast of Gatorade, Black Coffee or Tea (Do not add                 anything to coffee or tea).  __X__2.  On the morning of surgery brush your teeth with toothpaste and water, you                may rinse your mouth with mouthwash if you wish.  Do not swallow any toothpaste of mouthwash.     _X__ 3.  No Alcohol for 24 hours before or after surgery.   _X__ 4.  Do Not Smoke or use e-cigarettes For 24 Hours Prior to Your Surgery.                 Do not use any chewable tobacco products for at least 6 hours prior to                 surgery.  ____  5.  Bring all medications with you on the day of surgery if instructed.   _X___  6.  Notify your doctor if there is any change in your medical condition      (cold, fever, infections).     Do not wear jewelry, make-up, hairpins, clips or nail polish. Do not wear lotions, powders, or perfumes. You may wear deodorant. Do not shave 48 hours prior to surgery. Men may shave face and neck. Do not bring valuables to the hospital.    [facility] is not responsible for any belongings or  valuables.  Contacts, dentures or bridgework may not be worn into surgery. Leave your suitcase in the car. After surgery it may be brought to your room. For patients admitted to the hospital, discharge time is determined by your treatment team.   Patients discharged the day of surgery will not be allowed to drive home.   Please read over the following fact sheets that you were given:   Surgical Site Infection Prevention          ____ Take these medicines the morning of surgery with A SIP OF WATER:    1. BUPROPION  2. ALPRAZOLAM  3. SERTRALINE  4.  5.  6.  ____ Fleet Enema (as directed)  _X___ Use CHG Soap as directed  _X___ Use inhalers on the day of surgery   AND BRING  ____ Stop metformin 2 days prior to surgery    ____ Take 1/2 of usual insulin dose the night before surgery. No insulin the morning          of surgery.   ____ Stop Coumadin/Plavix/aspirin on  _X___ Stop Anti-inflammatories on     UNTIL AFTER SURGERY  ____ Stop supplements until after surgery.    ____ Bring C-Pap to the hospital.

## 2018-08-27 MED ORDER — CEFAZOLIN SODIUM-DEXTROSE 2-4 GM/100ML-% IV SOLN
2.0000 g | Freq: Once | INTRAVENOUS | Status: AC
  Administered 2018-08-28: 2 g via INTRAVENOUS

## 2018-08-28 ENCOUNTER — Encounter
Admission: RE | Disposition: A | Discharge status: Home / Self Care | Payer: Self-pay | Source: Ambulatory Visit | Attending: Obstetrics & Gynecology

## 2018-08-28 ENCOUNTER — Other Ambulatory Visit: Payer: Self-pay

## 2018-08-28 ENCOUNTER — Ambulatory Visit: Payer: Medicare Other | Admitting: Anesthesiology

## 2018-08-28 ENCOUNTER — Ambulatory Visit
Admission: RE | Admit: 2018-08-28 | Discharge: 2018-08-28 | Disposition: A | Discharge status: Home / Self Care | Payer: Medicare Other | Source: Ambulatory Visit | Attending: Obstetrics & Gynecology | Admitting: Obstetrics & Gynecology

## 2018-08-28 ENCOUNTER — Encounter: Payer: Self-pay | Admitting: Emergency Medicine

## 2018-08-28 DIAGNOSIS — N920 Excessive and frequent menstruation with regular cycle: Secondary | ICD-10-CM | POA: Diagnosis present

## 2018-08-28 DIAGNOSIS — F319 Bipolar disorder, unspecified: Secondary | ICD-10-CM | POA: Diagnosis not present

## 2018-08-28 DIAGNOSIS — N736 Female pelvic peritoneal adhesions (postinfective): Secondary | ICD-10-CM | POA: Insufficient documentation

## 2018-08-28 DIAGNOSIS — F4310 Post-traumatic stress disorder, unspecified: Secondary | ICD-10-CM | POA: Insufficient documentation

## 2018-08-28 DIAGNOSIS — F17210 Nicotine dependence, cigarettes, uncomplicated: Secondary | ICD-10-CM | POA: Insufficient documentation

## 2018-08-28 DIAGNOSIS — Z79899 Other long term (current) drug therapy: Secondary | ICD-10-CM | POA: Diagnosis not present

## 2018-08-28 DIAGNOSIS — J45909 Unspecified asthma, uncomplicated: Secondary | ICD-10-CM | POA: Insufficient documentation

## 2018-08-28 DIAGNOSIS — F419 Anxiety disorder, unspecified: Secondary | ICD-10-CM | POA: Diagnosis not present

## 2018-08-28 HISTORY — PX: LAPAROSCOPIC HYSTERECTOMY: SHX1926

## 2018-08-28 HISTORY — PX: LAPAROSCOPIC BILATERAL SALPINGECTOMY: SHX5889

## 2018-08-28 LAB — POCT PREGNANCY, URINE: PREG TEST UR: NEGATIVE

## 2018-08-28 LAB — ABO/RH: ABO/RH(D): O POS

## 2018-08-28 SURGERY — HYSTERECTOMY, TOTAL, LAPAROSCOPIC
Anesthesia: General | Laterality: Right

## 2018-08-28 MED ORDER — DEXAMETHASONE SODIUM PHOSPHATE 10 MG/ML IJ SOLN
INTRAMUSCULAR | Status: AC
  Filled 2018-08-28: qty 1

## 2018-08-28 MED ORDER — CELECOXIB 200 MG PO CAPS
400.0000 mg | ORAL_CAPSULE | Freq: Once | ORAL | Status: AC
  Administered 2018-08-28: 400 mg via ORAL

## 2018-08-28 MED ORDER — OXYCODONE HCL 5 MG PO TABS
5.0000 mg | ORAL_TABLET | Freq: Once | ORAL | Status: AC | PRN
  Administered 2018-08-28: 5 mg via ORAL

## 2018-08-28 MED ORDER — FAMOTIDINE 20 MG PO TABS
20.0000 mg | ORAL_TABLET | Freq: Once | ORAL | Status: AC
  Administered 2018-08-28: 20 mg via ORAL

## 2018-08-28 MED ORDER — ACETAMINOPHEN 500 MG PO TABS
ORAL_TABLET | ORAL | Status: AC
  Administered 2018-08-28: 1000 mg via ORAL
  Filled 2018-08-28: qty 2

## 2018-08-28 MED ORDER — CEFAZOLIN SODIUM-DEXTROSE 2-4 GM/100ML-% IV SOLN
INTRAVENOUS | Status: AC
  Filled 2018-08-28: qty 100

## 2018-08-28 MED ORDER — OXYCODONE HCL 5 MG/5ML PO SOLN: 5.0000 mg | Freq: Once | ORAL | Status: AC | PRN

## 2018-08-28 MED ORDER — ACETAMINOPHEN 500 MG PO TABS
1000.0000 mg | ORAL_TABLET | Freq: Once | ORAL | Status: AC
  Administered 2018-08-28: 1000 mg via ORAL

## 2018-08-28 MED ORDER — PROPOFOL 10 MG/ML IV BOLUS
INTRAVENOUS | Status: AC
  Filled 2018-08-28: qty 20

## 2018-08-28 MED ORDER — DEXAMETHASONE SODIUM PHOSPHATE 10 MG/ML IJ SOLN
INTRAMUSCULAR | Status: DC | PRN
  Administered 2018-08-28: 10 mg via INTRAVENOUS

## 2018-08-28 MED ORDER — GABAPENTIN 300 MG PO CAPS
600.0000 mg | ORAL_CAPSULE | Freq: Once | ORAL | Status: AC
  Administered 2018-08-28: 600 mg via ORAL

## 2018-08-28 MED ORDER — SUGAMMADEX SODIUM 200 MG/2ML IV SOLN
INTRAVENOUS | Status: AC
  Filled 2018-08-28: qty 2

## 2018-08-28 MED ORDER — OXYCODONE HCL 5 MG PO TABS: 5.0000 mg | ORAL_TABLET | ORAL | 0 refills | Status: DC | PRN

## 2018-08-28 MED ORDER — EPHEDRINE SULFATE 50 MG/ML IJ SOLN
INTRAMUSCULAR | Status: DC | PRN
  Administered 2018-08-28: 10 mg via INTRAVENOUS

## 2018-08-28 MED ORDER — ROCURONIUM BROMIDE 100 MG/10ML IV SOLN
INTRAVENOUS | Status: DC | PRN
  Administered 2018-08-28: 50 mg via INTRAVENOUS

## 2018-08-28 MED ORDER — OXYCODONE HCL 5 MG PO TABS
ORAL_TABLET | ORAL | Status: AC
  Filled 2018-08-28: qty 1

## 2018-08-28 MED ORDER — LACTATED RINGERS IV SOLN
INTRAVENOUS | Status: DC
  Administered 2018-08-28: 08:00:00 via INTRAVENOUS

## 2018-08-28 MED ORDER — HEPARIN SODIUM (PORCINE) 5000 UNIT/ML IJ SOLN
5000.0000 [IU] | Freq: Once | INTRAMUSCULAR | Status: AC
  Administered 2018-08-28: 5000 [IU] via SUBCUTANEOUS

## 2018-08-28 MED ORDER — FENTANYL CITRATE (PF) 100 MCG/2ML IJ SOLN
25.0000 ug | INTRAMUSCULAR | Status: DC | PRN
  Administered 2018-08-28: 50 ug via INTRAVENOUS
  Administered 2018-08-28 (×2): 25 ug via INTRAVENOUS
  Administered 2018-08-28: 50 ug via INTRAVENOUS

## 2018-08-28 MED ORDER — HEPARIN SODIUM (PORCINE) 5000 UNIT/ML IJ SOLN
INTRAMUSCULAR | Status: AC
  Administered 2018-08-28: 5000 [IU] via SUBCUTANEOUS
  Filled 2018-08-28: qty 1

## 2018-08-28 MED ORDER — PROMETHAZINE HCL 25 MG/ML IJ SOLN: 6.2500 mg | INTRAMUSCULAR | Status: DC | PRN

## 2018-08-28 MED ORDER — MORPHINE SULFATE (PF) 4 MG/ML IV SOLN: 1.0000 mg | INTRAVENOUS | Status: DC | PRN

## 2018-08-28 MED ORDER — LIDOCAINE HCL (CARDIAC) PF 100 MG/5ML IV SOSY
PREFILLED_SYRINGE | INTRAVENOUS | Status: DC | PRN
  Administered 2018-08-28: 100 mg via INTRAVENOUS

## 2018-08-28 MED ORDER — MEPERIDINE HCL 50 MG/ML IJ SOLN: 6.2500 mg | INTRAMUSCULAR | Status: DC | PRN

## 2018-08-28 MED ORDER — ONDANSETRON HCL 4 MG/2ML IJ SOLN
INTRAMUSCULAR | Status: DC | PRN
  Administered 2018-08-28: 4 mg via INTRAVENOUS

## 2018-08-28 MED ORDER — LIDOCAINE HCL (PF) 2 % IJ SOLN
INTRAMUSCULAR | Status: AC
  Filled 2018-08-28: qty 10

## 2018-08-28 MED ORDER — SUGAMMADEX SODIUM 200 MG/2ML IV SOLN
INTRAVENOUS | Status: DC | PRN
  Administered 2018-08-28: 200 mg via INTRAVENOUS

## 2018-08-28 MED ORDER — ONDANSETRON HCL 4 MG/2ML IJ SOLN
INTRAMUSCULAR | Status: AC
  Filled 2018-08-28: qty 2

## 2018-08-28 MED ORDER — BELLADONNA ALKALOIDS-OPIUM 16.2-60 MG RE SUPP
1.0000 | Freq: Once | RECTAL | Status: AC
  Administered 2018-08-28: 1 via RECTAL

## 2018-08-28 MED ORDER — PROPOFOL 10 MG/ML IV BOLUS
INTRAVENOUS | Status: DC | PRN
  Administered 2018-08-28: 200 mg via INTRAVENOUS

## 2018-08-28 MED ORDER — LIDOCAINE HCL (PF) 1 % IJ SOLN
INTRAMUSCULAR | Status: AC
  Filled 2018-08-28: qty 30

## 2018-08-28 MED ORDER — GABAPENTIN 300 MG PO CAPS
ORAL_CAPSULE | ORAL | Status: AC
  Administered 2018-08-28: 600 mg via ORAL
  Filled 2018-08-28: qty 2

## 2018-08-28 MED ORDER — FENTANYL CITRATE (PF) 100 MCG/2ML IJ SOLN
INTRAMUSCULAR | Status: DC | PRN
  Administered 2018-08-28 (×4): 50 ug via INTRAVENOUS

## 2018-08-28 MED ORDER — FAMOTIDINE 20 MG PO TABS
ORAL_TABLET | ORAL | Status: AC
  Administered 2018-08-28: 20 mg via ORAL
  Filled 2018-08-28: qty 1

## 2018-08-28 MED ORDER — LACTATED RINGERS IV SOLN: INTRAVENOUS | Status: DC

## 2018-08-28 MED ORDER — FENTANYL CITRATE (PF) 100 MCG/2ML IJ SOLN
INTRAMUSCULAR | Status: AC
  Filled 2018-08-28: qty 4

## 2018-08-28 MED ORDER — MIDAZOLAM HCL 2 MG/2ML IJ SOLN
INTRAMUSCULAR | Status: DC | PRN
  Administered 2018-08-28: 2 mg via INTRAVENOUS

## 2018-08-28 MED ORDER — BELLADONNA ALKALOIDS-OPIUM 16.2-60 MG RE SUPP
RECTAL | Status: AC
  Filled 2018-08-28: qty 1

## 2018-08-28 MED ORDER — MIDAZOLAM HCL 2 MG/2ML IJ SOLN
INTRAMUSCULAR | Status: AC
  Filled 2018-08-28: qty 2

## 2018-08-28 MED ORDER — IBUPROFEN 800 MG PO TABS: 800.0000 mg | ORAL_TABLET | Freq: Three times a day (TID) | ORAL | 1 refills | Status: DC | PRN

## 2018-08-28 MED ORDER — FENTANYL CITRATE (PF) 100 MCG/2ML IJ SOLN
INTRAMUSCULAR | Status: AC
  Administered 2018-08-28: 25 ug via INTRAVENOUS
  Filled 2018-08-28: qty 2

## 2018-08-28 MED ORDER — KETOROLAC TROMETHAMINE 30 MG/ML IJ SOLN
30.0000 mg | Freq: Four times a day (QID) | INTRAMUSCULAR | Status: DC
  Filled 2018-08-28: qty 1

## 2018-08-28 MED ORDER — ROCURONIUM BROMIDE 50 MG/5ML IV SOLN
INTRAVENOUS | Status: AC
  Filled 2018-08-28: qty 1

## 2018-08-28 MED ORDER — CELECOXIB 200 MG PO CAPS
ORAL_CAPSULE | ORAL | Status: AC
  Administered 2018-08-28: 400 mg via ORAL
  Filled 2018-08-28: qty 2

## 2018-08-28 MED ORDER — FENTANYL CITRATE (PF) 100 MCG/2ML IJ SOLN
INTRAMUSCULAR | Status: AC
  Filled 2018-08-28: qty 2

## 2018-08-28 MED ORDER — KETOROLAC TROMETHAMINE 30 MG/ML IJ SOLN
INTRAMUSCULAR | Status: AC
  Administered 2018-08-28: 30 mg
  Filled 2018-08-28: qty 1

## 2018-08-28 SURGICAL SUPPLY — 58 items
BACTOSHIELD CHG 4% 4OZ (MISCELLANEOUS) ×1
BAG URINE DRAINAGE (UROLOGICAL SUPPLIES) ×4 IMPLANT
BLADE SURG SZ11 CARB STEEL (BLADE) ×4 IMPLANT
CANISTER SUCT 1200ML W/VALVE (MISCELLANEOUS) ×4 IMPLANT
CATH FOLEY 2WAY  5CC 16FR (CATHETERS) ×2
CATH URTH 16FR FL 2W BLN LF (CATHETERS) ×2 IMPLANT
CHLORAPREP W/TINT 26ML (MISCELLANEOUS) ×8 IMPLANT
COVER LIGHT HANDLE STERIS (MISCELLANEOUS) ×4 IMPLANT
COVER WAND RF STERILE (DRAPES) ×4 IMPLANT
DEFOGGER SCOPE WARMER CLEARIFY (MISCELLANEOUS) ×4 IMPLANT
DERMABOND ADVANCED (GAUZE/BANDAGES/DRESSINGS) ×2
DERMABOND ADVANCED .7 DNX12 (GAUZE/BANDAGES/DRESSINGS) ×2 IMPLANT
DEVICE SUTURE ENDOST 10MM (ENDOMECHANICALS) IMPLANT
DRAPE LEGGINS SURG 28X43 STRL (DRAPES) ×4 IMPLANT
DRAPE SHEET LG 3/4 BI-LAMINATE (DRAPES) ×4 IMPLANT
DRAPE UNDER BUTTOCK W/FLU (DRAPES) ×4 IMPLANT
ELECT REM PT RETURN 9FT ADLT (ELECTROSURGICAL) ×4
ELECTRODE REM PT RTRN 9FT ADLT (ELECTROSURGICAL) ×2 IMPLANT
GLOVE PI ORTHOPRO 6.5 (GLOVE) ×2
GLOVE PI ORTHOPRO STRL 6.5 (GLOVE) ×2 IMPLANT
GLOVE SURG SYN 6.5 ES PF (GLOVE) ×12 IMPLANT
GOWN STRL REUS W/ TWL LRG LVL3 (GOWN DISPOSABLE) ×6 IMPLANT
GOWN STRL REUS W/ TWL XL LVL3 (GOWN DISPOSABLE) ×2 IMPLANT
GOWN STRL REUS W/TWL LRG LVL3 (GOWN DISPOSABLE) ×6
GOWN STRL REUS W/TWL XL LVL3 (GOWN DISPOSABLE) ×2
IRRIGATION STRYKERFLOW (MISCELLANEOUS) ×2 IMPLANT
IRRIGATOR STRYKERFLOW (MISCELLANEOUS) ×4
IV LACTATED RINGERS 1000ML (IV SOLUTION) ×4 IMPLANT
KIT PINK PAD W/HEAD ARE REST (MISCELLANEOUS) ×4
KIT PINK PAD W/HEAD ARM REST (MISCELLANEOUS) ×2 IMPLANT
KIT TURNOVER CYSTO (KITS) ×4 IMPLANT
L-HOOK LAP DISP 36CM (ELECTROSURGICAL) ×4
LABEL OR SOLS (LABEL) IMPLANT
LHOOK LAP DISP 36CM (ELECTROSURGICAL) ×2 IMPLANT
LIGASURE LAP MARYLAND 5MM 37CM (ELECTROSURGICAL) ×4 IMPLANT
LIGASURE VESSEL 5MM BLUNT TIP (ELECTROSURGICAL) IMPLANT
MANIPULATOR VCARE LG CRV RETR (MISCELLANEOUS) IMPLANT
MANIPULATOR VCARE SML CRV RETR (MISCELLANEOUS) ×4 IMPLANT
MANIPULATOR VCARE STD CRV RETR (MISCELLANEOUS) IMPLANT
NS IRRIG 500ML POUR BTL (IV SOLUTION) ×4 IMPLANT
PACK LAP CHOLECYSTECTOMY (MISCELLANEOUS) ×4 IMPLANT
PAD OB MATERNITY 4.3X12.25 (PERSONAL CARE ITEMS) ×4 IMPLANT
PAD PREP 24X41 OB/GYN DISP (PERSONAL CARE ITEMS) ×4 IMPLANT
PENCIL ELECTRO HAND CTR (MISCELLANEOUS) ×4 IMPLANT
SCRUB CHG 4% DYNA-HEX 4OZ (MISCELLANEOUS) ×3 IMPLANT
SET CYSTO W/LG BORE CLAMP LF (SET/KITS/TRAYS/PACK) IMPLANT
SET YANKAUER POOLE SUCT (MISCELLANEOUS) ×4 IMPLANT
SLEEVE ENDOPATH XCEL 5M (ENDOMECHANICALS) ×8 IMPLANT
SURGILUBE 2OZ TUBE FLIPTOP (MISCELLANEOUS) ×4 IMPLANT
SUT ENDO VLOC 180-0-8IN (SUTURE) IMPLANT
SUT MNCRL 4-0 (SUTURE) ×2
SUT MNCRL 4-0 27XMFL (SUTURE) ×2
SUT MNCRL AB 4-0 PS2 18 (SUTURE) ×4 IMPLANT
SUT VIC AB 0 CT1 36 (SUTURE) ×8 IMPLANT
SUTURE MNCRL 4-0 27XMF (SUTURE) ×2 IMPLANT
TROCAR XCEL NON-BLD 5MMX100MML (ENDOMECHANICALS) ×4 IMPLANT
TUBING INSUF HEATED (TUBING) ×4 IMPLANT
TUBING INSUFFLATION (TUBING) ×4 IMPLANT

## 2018-08-28 NOTE — OR Nursing (Signed)
Dr. Elesa Massed notified of continued rectal pressure.  Will come to insert B&O suppository. Patient more comfortable sitting up on bedside commode at bedside.

## 2018-08-28 NOTE — Anesthesia Procedure Notes (Signed)
Procedure Name: Intubation Performed by: Philbert Riser, CRNA Pre-anesthesia Checklist: Patient identified, Emergency Drugs available, Suction available, Patient being monitored and Timeout performed Patient Re-evaluated:Patient Re-evaluated prior to induction Oxygen Delivery Method: Circle system utilized and Simple face mask Preoxygenation: Pre-oxygenation with 100% oxygen Induction Type: IV induction Ventilation: Mask ventilation without difficulty Laryngoscope Size: Mac and 3 Grade View: Grade I Tube type: Oral Tube size: 7.0 mm Number of attempts: 1 Airway Equipment and Method: Stylet Placement Confirmation: ETT inserted through vocal cords under direct vision,  positive ETCO2 and breath sounds checked- equal and bilateral Secured at: 22 cm Tube secured with: Tape

## 2018-08-28 NOTE — Discharge Instructions (Addendum)
Discharge instructions:  °Call office if you have any of the following: fever >101 F, chills, shortness of breath, excessive vaginal bleeding, incision drainage or problems, leg pain or redness, or any other concerns.  ° °Activity: Do not lift > 10 lbs for 8 weeks.  °No intercourse or tampons for 8 weeks.  °No driving for 1-2 weeks.  ° °You may feel some pain in your upper right abdomen/rib and right shoulder.  This is from the gas in the abdomen for surgery. This will subside over time, please be patient! ° °Take 800mg Ibuprofen and 1000mg Tylenol around the clock, every 6 hours for at least the first 3-5 days.  After this you can take as needed.  This will help decrease inflammation and promote healing.  The narcotics you'll take just as needed, as they just trick your brain into thinking its not in pain.   ° °Please don't limit yourself in terms of routine activity.  You will be able to do most things, although they may take longer to do or be a little painful.  You can do it! ° °Don't be a hero, but don't be a wimp either!  ° ° °AMBULATORY SURGERY  °DISCHARGE INSTRUCTIONS ° ° °1) The drugs that you were given will stay in your system until tomorrow so for the next 24 hours you should not: ° °A) Drive an automobile °B) Make any legal decisions °C) Drink any alcoholic beverage ° ° °2) You may resume regular meals tomorrow.  Today it is better to start with liquids and gradually work up to solid foods. ° °You may eat anything you prefer, but it is better to start with liquids, then soup and crackers, and gradually work up to solid foods. ° ° °3) Please notify your doctor immediately if you have any unusual bleeding, trouble breathing, redness and pain at the surgery site, drainage, fever, or pain not relieved by medication. ° ° ° °4) Additional Instructions: ° ° ° ° ° ° ° °Please contact your physician with any problems or Same Day Surgery at [phone], Monday through Friday 6 am to 4 pm, or [place] at  [place] [facility] number at [phone]. ° °

## 2018-08-28 NOTE — Anesthesia Postprocedure Evaluation (Signed)
Anesthesia Post Note  Patient: Bailey [name]  Procedure(s) Performed: HYSTERECTOMY TOTAL LAPAROSCOPIC (N/A ) LAPAROSCOPIC SALPINGECTOMY (Right )  Patient location during evaluation: PACU Anesthesia Type: General Level of consciousness: awake and alert and oriented Pain management: pain level controlled Vital Signs Assessment: post-procedure vital signs reviewed and stable Respiratory status: spontaneous breathing, nonlabored ventilation and respiratory function stable Cardiovascular status: blood pressure returned to baseline and stable Postop Assessment: no signs of nausea or vomiting Anesthetic complications: no     Last Vitals:  Vitals:   08/28/18 1029 08/28/18 1030  BP: 134/80   Pulse: 67 67  Resp: 15 (!) 23  Temp: (!) 36.3 C   SpO2: 100% 100%    Last Pain:  Vitals:   08/28/18 1029  TempSrc:   PainSc: 8                  Winthrop Shannahan

## 2018-08-28 NOTE — OR Nursing (Signed)
Dr. Elesa Massed here.  Inserted B&O suppository.  Will wait for improved comfort and urination before dc home.

## 2018-08-28 NOTE — Transfer of Care (Signed)
Immediate Anesthesia Transfer of Care Note  Patient: Bailey [name]  Procedure(s) Performed: HYSTERECTOMY TOTAL LAPAROSCOPIC (N/A ) LAPAROSCOPIC SALPINGECTOMY (Right )  Patient Location: PACU  Anesthesia Type:General  Level of Consciousness: awake, alert  and oriented  Airway & Oxygen Therapy: Patient Spontanous Breathing and Patient connected to face mask oxygen  Post-op Assessment: Report given to RN and Post -op Vital signs reviewed and stable  Post vital signs: Reviewed and stable  Last Vitals:  Vitals Value Taken Time  BP    Temp    Pulse    Resp    SpO2      Last Pain:  Vitals:   08/28/18 0624  TempSrc: Tympanic  PainSc: 0-No pain         Complications: No apparent anesthesia complications

## 2018-08-28 NOTE — Anesthesia Preprocedure Evaluation (Signed)
Anesthesia Evaluation  Patient identified by MRN, date of birth, ID band Patient awake    Reviewed: Allergy & Precautions, NPO status , Patient's Chart, lab work & pertinent test results  History of Anesthesia Complications Negative for: history of anesthetic complications  Airway Mallampati: II  TM Distance: >3 FB Neck ROM: Full    Dental no notable dental hx.    Pulmonary asthma , Current Smoker,    breath sounds clear to auscultation- rhonchi (-) wheezing      Cardiovascular Exercise Tolerance: Good (-) hypertension(-) CAD, (-) Past MI, (-) Cardiac Stents and (-) CABG  Rhythm:Regular Rate:Normal - Systolic murmurs and - Diastolic murmurs    Neuro/Psych  Headaches, PSYCHIATRIC DISORDERS Anxiety Depression Bipolar Disorder    GI/Hepatic negative GI ROS, Neg liver ROS,   Endo/Other  negative endocrine ROSneg diabetes  Renal/GU negative Renal ROS     Musculoskeletal negative musculoskeletal ROS (+)   Abdominal (+) + obese,   Peds  Hematology  (+) anemia ,   Anesthesia Other Findings Past Medical History: No date: Anemia No date: Anxiety No date: Asthma No date: Bipolar disorder (HCC) No date: Cough     Comment:  chronic smoker  No date: Depression No date: Headache No date: PTSD (post-traumatic stress disorder)   Reproductive/Obstetrics                             Anesthesia Physical Anesthesia Plan  ASA: II  Anesthesia Plan: General   Post-op Pain Management:    Induction: Intravenous  PONV Risk Score and Plan: 1 and Ondansetron and Midazolam  Airway Management Planned: Oral ETT  Additional Equipment:   Intra-op Plan:   Post-operative Plan: Extubation in OR  Informed Consent: I have reviewed the patients History and Physical, chart, labs and discussed the procedure including the risks, benefits and alternatives for the proposed anesthesia with the patient or  authorized representative who has indicated his/her understanding and acceptance.   Dental advisory given  Plan Discussed with: CRNA and Anesthesiologist  Anesthesia Plan Comments:         Anesthesia Quick Evaluation

## 2018-08-28 NOTE — Anesthesia Post-op Follow-up Note (Signed)
Anesthesia QCDR form completed.        

## 2018-08-28 NOTE — Op Note (Signed)
Total Laparoscopic Hysterectomy Operative Note Procedure Date: 08/28/2018  Patient:  Bailey [name]  32 y.o. female  PRE-OPERATIVE DIAGNOSIS:  menorrhagia  POST-OPERATIVE DIAGNOSIS:  menorrhagia  PROCEDURE:  Procedure(s): HYSTERECTOMY TOTAL LAPAROSCOPIC (N/A) LAPAROSCOPIC SALPINGECTOMY (Right)  SURGEON:  Surgeon(s) and Role:    * Ward, Elenora Fender, MD - Primary    * Schermerhorn, Ihor Austin, MD - Assisting  ANESTHESIA:  General via ET  I/O  Total I/O In: 1000 [I.V.:1000] Out: 175 [Urine:150; Blood:25]  FINDINGS:  Small mobile uterus, normal ovaries and fallopian tube on the RIGHT, absent LEFT fallopian tube.  Normal upper abdomen. Slight scarring of bladder to anterior uterus at LUS.  Omental scarring in the LUQ to the anterior abdominal wall.  SPECIMEN: Uterus, Cervix, and right allopian tube  COMPLICATIONS: none apparent  DISPOSITION: vital signs stable to PACU  Indication for Surgery: 32 y.o. with menorrhagia unresponsive to hormonal manipulation, requesting definitive surgery.  Risks of surgery were discussed with the patient including but not limited to: bleeding which may require transfusion or reoperation; infection which may require antibiotics; injury to bowel, bladder, ureters or other surrounding organs; need for additional procedures including laparotomy, blood clot, incisional problems and other postoperative/anesthesia complications. Written informed consent was obtained.      PROCEDURE IN DETAIL:  The patient had 5000u Heparin Sub-q and sequential compression devices applied to her lower extremities while in the preoperative area.  She was then taken to the operating room. IV antibiotics were given. General anesthesia was administered via endotracheal route.  She was placed in the dorsal lithotomy position, and was prepped and draped in a sterile manner. A surgical time-out was performed.  A Foley catheter was inserted into her bladder and attached to constant  drainage and a V-Care uterine manipulator was then advanced into the uterus and a good fit around the cervix was noted. The gloves were changed, and attention was turned to the abdomen where an umbilical incision was made with the scalpel.  A 5mm trochar was inserted in the umbilical incision using a visiport method.Opening pressure was , and the abdomen was insufflated to carbon dioxide gas and adequate pneumoperitoneum was obtained. A survey of the patient's pelvis and abdomen revealed the findings as mentioned above. Two 5mm ports were inserted in the lower left and right quadrants under visualization.    The right fallopian tubes was separated from the mesosalpinx using the Ligasure. The bilateral round ligaments were transected and anterior broad ligament divided and brought across the uterus to separate the vesicouterine peritoneum and create a bladder flap. The bladder was pushed away from the uterus. The bilateral uterine arteries were skeletonized, ligated and transected. The bilateral uterosacral and cardinal ligaments were ligated and transected. A colpotomy was made around the V-Care cervical cup and the uterus, cervix, and right tube were removed through the vagina. The vaginal cuff was closed vaginally using 0-Vicryl in a running locking stitch. This was tested for integrity using the surgeon's finger. After a change of gloves, the pneumoperitoneum was recreated and surgical site inspected, and found to be hemostatic. Bilateral ureters were visualized vermiuclating. No intraoperative injury to surrounding organs was noted. The abdomen was desufflated and all instruments were then removed.   All skin incisions were closed with 4-0 monocryl and covered with surgical glue. The patient tolerated the procedures well.  All instruments, needles, and sponge counts were correct x 2. The patient was taken to the recovery room in stable condition.   ---- Ranae Plumber,  MD Attending  Obstetrician and Gynecologist [facility]

## 2018-08-28 NOTE — H&P (Signed)
Preoperative History and Physical  Bailey [name] is a 32 y.o. here for surgical management of menorrhagia.   No significant preoperative concerns.  Proposed surgery: total laparoscopic hysterectomy, bilateral salpingectomy  Past Medical History:  Diagnosis Date  . Anemia   . Anxiety   . Asthma   . Bipolar disorder (HCC)   . Cough    chronic smoker   . Depression   . Headache   . PTSD (post-traumatic stress disorder)    Past Surgical History:  Procedure Laterality Date  . BREAST REDUCTION SURGERY  2004  . BREAST SURGERY    . CESAREAN SECTION    . CESAREAN SECTION    . GALLBLADDER SURGERY  [id number]  . HERNIA REPAIR    . TONSILLECTOMY    . TONSILLECTOMY AND ADENOIDECTOMY  [id number]  . TUBAL LIGATION     OB History  No data available  Patient denies any other pertinent gynecologic issues.   No current facility-administered medications on file prior to encounter.    Current Outpatient Medications on File Prior to Encounter  Medication Sig Dispense Refill  . albuterol (PROAIR HFA) 108 (90 BASE) MCG/ACT inhaler Inhale 2 puffs into the lungs every 6 (six) hours as needed for wheezing or shortness of breath.     . alprazolam (XANAX) 2 MG tablet Take 2 mg by mouth 2 (two) times daily as needed for anxiety.    Marland Kitchen buPROPion (WELLBUTRIN XL) 150 MG 24 hr tablet Take 150 mg by mouth every morning.  1  . cyclobenzaprine (FLEXERIL) 10 MG tablet Take 10 mg by mouth 2 (two) times daily as needed for muscle spasms.    . fluticasone (FLOVENT HFA) 110 MCG/ACT inhaler Inhale 2 puffs into the lungs daily as needed (for shortness of breath).     Marland Kitchen levonorgestrel (MIRENA) 20 MCG/24HR IUD 1 each by Intrauterine route once.     . prazosin (MINIPRESS) 5 MG capsule Take 5 mg by mouth at bedtime.    Marland Kitchen QUEtiapine (SEROQUEL) 400 MG tablet Take 400 mg by mouth at bedtime.  1  . sertraline (ZOLOFT) 100 MG tablet Take 2 tablets (200 mg total) by mouth daily. 60 tablet 0  . zolpidem (AMBIEN) 10 MG tablet  Take 10 mg by mouth at bedtime.     Allergies  Allergen Reactions  . Sumatriptan Rash  . Hydrocodone-Acetaminophen Nausea Only  . Sumatriptan Succinate Rash    Social History:   reports that she has been smoking cigarettes. She started smoking about 15 years ago. She has been smoking about 0.50 packs per day. She has never used smokeless tobacco. She reports that she drinks alcohol. She reports that she does not use drugs.  Family History  Problem Relation Age of Onset  . Hypertension Mother   . Anxiety disorder Mother   . Depression Mother   . Anxiety disorder Father   . Depression Father   . Diabetes Maternal Aunt   . Diabetes Maternal Grandfather     Review of Systems: Noncontributory  PHYSICAL EXAM: Blood pressure 111/83, pulse 78, temperature (!) 97.2 F (36.2 C), temperature source Tympanic, resp. rate 17, height 5\' 7"  (1.702 m), weight 98.2 kg, SpO2 98 %. General appearance - alert, well appearing, and in no distress Chest - clear to auscultation, no wheezes, rales or rhonchi, symmetric air entry Heart - normal rate and regular rhythm Abdomen - soft, nontender, nondistended, no masses or organomegaly Pelvic - examination not indicated Extremities - peripheral pulses normal, no pedal edema, no  clubbing or cyanosis  Labs: Results for orders placed or performed during the hospital encounter of 08/28/18 (from the past 336 hour(s))  Pregnancy, urine POC   Collection Time: 08/28/18  6:20 AM  Result Value Ref Range   Preg Test, Ur NEGATIVE NEGATIVE  ABO/Rh   Collection Time: 08/28/18  6:29 AM  Result Value Ref Range   ABO/RH(D)      O POS Performed at [facility], [address] [place], [place] [address]   Results for orders placed or performed during the hospital encounter of 08/21/18 (from the past 336 hour(s))  Type and screen [facility]   Collection Time: 08/21/18  9:37 AM  Result Value Ref Range   ABO/RH(D) O POS    Antibody  Screen NEG    Sample Expiration 09/04/2018    Extend sample reason      NO TRANSFUSIONS OR PREGNANCY IN THE PAST 3 MONTHS Performed at [facility], [address] [place], [place] [address]     Imaging Studies: No results found.  Assessment: Patient Active Problem List   Diagnosis Date Noted  . Leukocytosis 08/10/2017  . Easy bruising 07/29/2017  . Hernia of anterior abdominal wall 08/22/2015  . Neurosis, posttraumatic 07/29/2015  . 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 History of migraine headaches 07/29/2015  . Depression, major, recurrent, moderate (HCC) 07/29/2015  . Insomnia, persistent 07/29/2015  . H/O disease 07/29/2015  . Diabetes mellitus arising in pregnancy 07/29/2015  . Anxiety, generalized 07/29/2015  . History of cervical cancer 07/29/2015  . Borderline personality disorder (HCC) 07/29/2015  . Bipolar affective disorder (HCC) 07/29/2015  . Asthma without status asthmaticus 05/05/2015  . Frequent UTI 12/04/2014  . Cervical pain 09/18/2014  . Amnesia 09/18/2014  . Chronic tension-type headache, intractable 09/18/2014  . Disordered sleep 06/06/2014  . Anxiety 05/09/2014  . Has a tremor 04/25/2014  . Compulsive tobacco user syndrome 04/25/2014  . Speech problem 04/25/2014  . Numbness and tingling 04/25/2014  . Cephalalgia 04/25/2014  . Difficulty in walking 04/25/2014  . Clinical depression 04/25/2014  . Blurred vision 04/25/2014  . Current tobacco use 04/25/2014  . Affective bipolar disorder (HCC) 03/08/2014  . Breath shortness 03/04/2013  . Airway hyperreactivity 03/04/2013    Plan: Patient will undergo surgical management with [facility] BS.   The risks of surgery were discussed in detail with the patient including but not limited to: bleeding which may require transfusion or reoperation; infection which may require antibiotics; injury to surrounding organs which may involve bowel, bladder, ureters ; need for additional procedures including  laparoscopy or laparotomy; thromboembolic phenomenon, surgical site problems and other postoperative/anesthesia complications. Likelihood of success in alleviating the patient's condition was discussed. Routine postoperative instructions will be reviewed with the patient and her family in detail after surgery.  The patient concurred with the proposed plan, giving informed written consent for the surgery.  Patient has been NPO since last night she will remain NPO for procedure.  Anesthesia and OR aware.  Preoperative prophylactic antibiotics and SCDs ordered on call to the OR.  To OR when ready.  ----- Ranae Plumber, MD Attending Obstetrician and Gynecologist [facility], Department of OB/GYN [facility]

## 2018-08-28 NOTE — OR Nursing (Signed)
Able to empty her bladder.  100cc of clear urine. " Pain is much improved.  I want to go home." DC instructions complete.

## 2018-08-30 LAB — SURGICAL PATHOLOGY

## 2018-11-12 ENCOUNTER — Other Ambulatory Visit: Payer: Self-pay

## 2018-11-12 ENCOUNTER — Encounter: Payer: Self-pay | Admitting: Emergency Medicine

## 2018-11-12 ENCOUNTER — Ambulatory Visit
Admission: EM | Admit: 2018-11-12 | Discharge: 2018-11-12 | Disposition: A | Discharge status: Home / Self Care | Payer: Medicare Other | Attending: Family Medicine | Admitting: Family Medicine

## 2018-11-12 DIAGNOSIS — J4521 Mild intermittent asthma with (acute) exacerbation: Secondary | ICD-10-CM | POA: Diagnosis not present

## 2018-11-12 DIAGNOSIS — J0100 Acute maxillary sinusitis, unspecified: Secondary | ICD-10-CM | POA: Insufficient documentation

## 2018-11-12 DIAGNOSIS — J069 Acute upper respiratory infection, unspecified: Secondary | ICD-10-CM | POA: Diagnosis not present

## 2018-11-12 MED ORDER — BENZONATATE 200 MG PO CAPS: ORAL_CAPSULE | ORAL | 0 refills | Status: DC

## 2018-11-12 MED ORDER — GUAIFENESIN-CODEINE 100-10 MG/5ML PO SYRP: 5.0000 mL | ORAL_SOLUTION | Freq: Three times a day (TID) | ORAL | 0 refills | Status: DC | PRN

## 2018-11-12 MED ORDER — AMOXICILLIN-POT CLAVULANATE 875-125 MG PO TABS: 1.0000 | ORAL_TABLET | Freq: Two times a day (BID) | ORAL | 0 refills | Status: DC

## 2018-11-12 NOTE — ED Provider Notes (Signed)
[facility]    CSN: [id number] Arrival date & time: 11/12/18  0830     History   Chief Complaint Chief Complaint  Patient presents with  . Cough  . Nasal Congestion    HPI Bailey [name] is a 33 y.o. female.   HPI  -year-old female presents with cough nasal congestion facial pain and swelling started about 3 weeks ago.  She said no fever or chills.  She has a previous history of asthma and has noticed wheezing particularly at nighttime.  Cough has been productive.  That is recent him.  Her previous symptoms seem to be mostly in the nasal area.  She is afebrile.  Pulse of 80 blood pressure 127/84 respirations 18 and O2 sats 100% on room air.  States that she has 2 sleep in a semi-reclining position are otherwise has shortness of breath and coughing throughout the night.        Past Medical History:  Diagnosis Date  . Anemia   . Anxiety   . Asthma   . Bipolar disorder (HCC)   . Cough    chronic smoker   . Depression   . Headache   . PTSD (post-traumatic stress disorder)     Patient Active Problem List   Diagnosis Date Noted  . Leukocytosis 08/10/2017  . Easy bruising 07/29/2017  . Hernia of anterior abdominal wall 08/22/2015  . Neurosis, posttraumatic 07/29/2015  . 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 History of migraine headaches 07/29/2015  . Depression, major, recurrent, moderate (HCC) 07/29/2015  . Insomnia, persistent 07/29/2015  . H/O disease 07/29/2015  . Diabetes mellitus arising in pregnancy 07/29/2015  . Anxiety, generalized 07/29/2015  . History of cervical cancer 07/29/2015  . Borderline personality disorder (HCC) 07/29/2015  . Bipolar affective disorder (HCC) 07/29/2015  . Asthma without status asthmaticus 05/05/2015  . Frequent UTI 12/04/2014  . Cervical pain 09/18/2014  . Amnesia 09/18/2014  . Chronic tension-type headache, intractable 09/18/2014  . Disordered sleep 06/06/2014  . Anxiety 05/09/2014  . Has a tremor  04/25/2014  . Compulsive tobacco user syndrome 04/25/2014  . Speech problem 04/25/2014  . Numbness and tingling 04/25/2014  . Cephalalgia 04/25/2014  . Difficulty in walking 04/25/2014  . Clinical depression 04/25/2014  . Blurred vision 04/25/2014  . Current tobacco use 04/25/2014  . Affective bipolar disorder (HCC) 03/08/2014  . Breath shortness 03/04/2013  . Airway hyperreactivity 03/04/2013    Past Surgical History:  Procedure Laterality Date  . ABDOMINAL HYSTERECTOMY    . BREAST REDUCTION SURGERY  2004  . BREAST SURGERY    . CESAREAN SECTION    . CESAREAN SECTION    . GALLBLADDER SURGERY  [id number]  . HERNIA REPAIR    . LAPAROSCOPIC BILATERAL SALPINGECTOMY Right 08/28/2018   Procedure: LAPAROSCOPIC SALPINGECTOMY;  Surgeon: Ward, Elenora Fender, MD;  Location: [facility] ORS;  Service: Gynecology;  Laterality: Right;  . LAPAROSCOPIC HYSTERECTOMY N/A 08/28/2018   Procedure: HYSTERECTOMY TOTAL LAPAROSCOPIC;  Surgeon: Ward, Elenora Fender, MD;  Location: [facility] ORS;  Service: Gynecology;  Laterality: N/A;  . TONSILLECTOMY    . TONSILLECTOMY AND ADENOIDECTOMY  [id number]  . TUBAL LIGATION      OB History   No obstetric history on file.      Home Medications    Prior to Admission medications   Medication Sig Start Date End Date Taking? Authorizing Provider  albuterol (PROAIR HFA) 108 (90 BASE) MCG/ACT inhaler Inhale 2 puffs into the lungs every 6 (six) hours as needed  for wheezing or shortness of breath.  03/04/13  Yes [provider]  alprazolam Prudy Feeler(XANAX) 2 MG tablet Take 2 mg by mouth 2 (two) times daily as needed for anxiety.   Yes [provider]  buPROPion (WELLBUTRIN XL) 150 MG 24 hr tablet Take 150 mg by mouth every morning. 07/19/18  Yes [provider]  cyclobenzaprine (FLEXERIL) 10 MG tablet Take 10 mg by mouth 2 (two) times daily as needed for muscle spasms.   Yes [provider]  ibuprofen (ADVIL,MOTRIN) 800 MG tablet Take 1 tablet (800 mg total) by  mouth every 8 (eight) hours as needed. 08/28/18  Yes Ward, Elenora Fenderhelsea C, MD  prazosin (MINIPRESS) 5 MG capsule Take 5 mg by mouth at bedtime.   Yes [provider]  QUEtiapine (SEROQUEL) 400 MG tablet Take 400 mg by mouth at bedtime. 07/19/18  Yes [provider]  sertraline (ZOLOFT) 100 MG tablet Take 2 tablets (200 mg total) by mouth daily. 02/24/16  Yes Brandy HaleFaheem, Uzma, MD  zolpidem (AMBIEN) 10 MG tablet Take 10 mg by mouth at bedtime.   Yes [provider]  amoxicillin-clavulanate (AUGMENTIN) 875-125 MG tablet Take 1 tablet by mouth every 12 (twelve) hours. 11/12/18   Lutricia Feiloemer, Dream Harman P, PA-C  benzonatate (TESSALON) 200 MG capsule Take one cap TID PRN cough 11/12/18   Ovid Curdoemer, Deangleo Passage P, PA-C  fluticasone (FLOVENT HFA) 110 MCG/ACT inhaler Inhale 2 puffs into the lungs daily as needed (for shortness of breath).  07/18/17 08/21/18  [provider]  guaiFENesin-codeine (CHERATUSSIN AC) 100-10 MG/5ML syrup Take 5 mLs by mouth 3 (three) times daily as needed for cough. 11/12/18   Lutricia Feiloemer, Allyson Tineo P, PA-C    Family History Family History  Problem Relation Age of Onset  . Hypertension Mother   . Anxiety disorder Mother   . Depression Mother   . Anxiety disorder Father   . Depression Father   . Diabetes Maternal Aunt   . Diabetes Maternal Grandfather     Social History Social History   Tobacco Use  . Smoking status: Current Some Day Smoker    Packs/day: 0.50    Types: Cigarettes    Start date: 05/05/2003  . Smokeless tobacco: Never Used  Substance Use Topics  . Alcohol use: Yes    Alcohol/week: 0.0 standard drinks    Comment: rare  . Drug use: No     Allergies   Sumatriptan; Hydrocodone-acetaminophen; and Sumatriptan succinate   Review of Systems Review of Systems  Constitutional: Positive for activity change, chills and fatigue. Negative for fever.  HENT: Positive for congestion, postnasal drip, rhinorrhea, sinus pressure and sinus pain.   Respiratory:  Positive for cough and wheezing.   All other systems reviewed and are negative.    Physical Exam Triage Vital Signs ED Triage Vitals  Enc Vitals Group     BP 11/12/18 0843 127/84     Pulse Rate 11/12/18 0843 80     Resp 11/12/18 0843 18     Temp 11/12/18 0843 98.7 F (37.1 C)     Temp Source 11/12/18 0843 Oral     SpO2 11/12/18 0843 100 %     Weight 11/12/18 0841 213 lb (96.6 kg)     Height 11/12/18 0841 5\' 7"  (1.702 m)     Head Circumference --      Peak Flow --      Pain Score 11/12/18 0841 9     Pain Loc --      Pain Edu? --  Excl. in GC? --    No data found.  Updated Vital Signs BP 127/84 (BP Location: Left Arm)   Pulse 80   Temp 98.7 F (37.1 C) (Oral)   Resp 18   Ht 5\' 7"  (1.702 m)   Wt 213 lb (96.6 kg)   LMP  (LMP Unknown)   SpO2 100%   BMI 33.36 kg/m   Visual Acuity Right Eye Distance:   Left Eye Distance:   Bilateral Distance:    Right Eye Near:   Left Eye Near:    Bilateral Near:     Physical Exam Vitals signs and nursing note reviewed.  Constitutional:      General: She is not in acute distress.    Appearance: Normal appearance. She is obese. She is not toxic-appearing or diaphoretic.  HENT:     Head: Normocephalic and atraumatic.     Right Ear: There is impacted cerumen.     Ears:     Comments: Left TM is very erythematous.  No bulging or retraction is seen.    Nose: Congestion and rhinorrhea present.     Mouth/Throat:     Mouth: Mucous membranes are moist.     Pharynx: Oropharynx is clear. No oropharyngeal exudate or posterior oropharyngeal erythema.  Eyes:     General:        Right eye: No discharge.        Left eye: No discharge.     Conjunctiva/sclera: Conjunctivae normal.  Neck:     Musculoskeletal: Normal range of motion and neck supple.  Pulmonary:     Effort: Pulmonary effort is normal.     Breath sounds: Normal breath sounds.  Musculoskeletal: Normal range of motion.  Lymphadenopathy:     Cervical: No cervical  adenopathy.  Skin:    General: Skin is warm and dry.  Neurological:     General: No focal deficit present.     Mental Status: She is alert and oriented to person, place, and time.  Psychiatric:        Mood and Affect: Mood normal.        Behavior: Behavior normal.        Thought Content: Thought content normal.        Judgment: Judgment normal.      UC Treatments / Results  Labs (all labs ordered are listed, but only abnormal results are displayed) Labs Reviewed - No data to display  EKG None  Radiology No results found.  Procedures Procedures (including critical care time)  Medications Ordered in UC Medications - No data to display  Initial Impression / Assessment and Plan / UC Course  I have reviewed the triage vital signs and the nursing notes.  Pertinent labs & imaging results that were available during my care of the patient were reviewed by me and considered in my medical decision making (see chart for details).   And has several symptoms that all seem interconnected.  She has an upper respiratory infection with acute asthma exacerbations at nighttime.  She also has an element of maxillary sinusitis.  I will start her on Augmentin.  Also provide her with suppressants.  Encouraged her to use the Flonase and her inhalers as necessary.  She should consider following up with her primary care physician in a week or sooner if she is not improving   Final Clinical Impressions(s) / UC Diagnoses   Final diagnoses:  Acute upper respiratory infection  Mild intermittent asthma with acute exacerbation  Acute maxillary  sinusitis, recurrence not specified     Discharge Instructions     Drink plenty of fluids.  Rest as much as possible.  Use Tylenol or Motrin for fever chills or body aches.    ED Prescriptions    Medication Sig Dispense Auth. Provider   amoxicillin-clavulanate (AUGMENTIN) 875-125 MG tablet Take 1 tablet by mouth every 12 (twelve) hours. 20 tablet Ovid Curd P, PA-C   benzonatate (TESSALON) 200 MG capsule Take one cap TID PRN cough 30 capsule Ovid Curd P, PA-C   guaiFENesin-codeine (CHERATUSSIN AC) 100-10 MG/5ML syrup Take 5 mLs by mouth 3 (three) times daily as needed for cough. 120 mL Lutricia Feil, PA-C     Controlled Substance Prescriptions [employer] Controlled Substance Registry consulted? Not Applicable   Lutricia Feil, PA-C 11/12/18 1610

## 2018-11-12 NOTE — ED Triage Notes (Signed)
Patient c/o cough, nasal congestion and facial pain that started 3 weeks ago. Patient denies fever.

## 2018-11-12 NOTE — Discharge Instructions (Addendum)
Drink plenty of fluids.  Rest as much as possible.  Use Tylenol or Motrin for fever chills or body aches.  °

## 2019-08-17 ENCOUNTER — Other Ambulatory Visit: Payer: Self-pay

## 2019-08-17 ENCOUNTER — Encounter: Payer: Self-pay | Admitting: Psychiatry

## 2019-08-17 ENCOUNTER — Ambulatory Visit (INDEPENDENT_AMBULATORY_CARE_PROVIDER_SITE_OTHER): Payer: Medicare Other | Admitting: Psychiatry

## 2019-08-17 DIAGNOSIS — F4310 Post-traumatic stress disorder, unspecified: Secondary | ICD-10-CM | POA: Diagnosis not present

## 2019-08-17 DIAGNOSIS — F331 Major depressive disorder, recurrent, moderate: Secondary | ICD-10-CM | POA: Diagnosis not present

## 2019-08-17 MED ORDER — ESCITALOPRAM OXALATE 10 MG PO TABS: 10.0000 mg | ORAL_TABLET | Freq: Every day | ORAL | 1 refills | Status: DC

## 2019-08-17 MED ORDER — PRAZOSIN HCL 2 MG PO CAPS: 2.0000 mg | ORAL_CAPSULE | Freq: Every day | ORAL | 1 refills | Status: DC
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
## 2019-09-10 ENCOUNTER — Telehealth: Payer: Self-pay

## 2019-09-10 NOTE — Telephone Encounter (Signed)
pt called left message that she feels like the lexapro is cause her to have headache and vomiting spells.  pt would like to come office the lexapro. please advise.

## 2019-09-10 NOTE — Telephone Encounter (Signed)
If she is having side effects please advice her to stop Lexapro and talk to [facility] when she returns.

## 2019-09-11 MED ORDER — TEMAZEPAM 15 MG PO CAPS: 15.0000 mg | ORAL_CAPSULE | Freq: Every day | ORAL | 0 refills | Status: DC

## 2019-09-11 MED ORDER — VILAZODONE HCL 20 MG PO TABS: 20.0000 mg | ORAL_TABLET | Freq: Every day | ORAL | 1 refills | Status: DC

## 2019-09-11 NOTE — Telephone Encounter (Signed)
I called and spoke with the patient. She informed that lexapro made her vomit each time she took it. She tried taking it at night but that too did not help. She stopped taking the Lexapro last Thursday and since then she has not had any vomiting.  She also informed that she was having headaches recommended that she discontinue her melatonin.  Since discontinuing melatonin her headaches have improved.  She asked if she could try something else for her depression anxiety and also for sleep.  Patient has been on several different medications in the past.  She has taken sertraline, Paxil, Prozac.  And now Lexapro made her have vomiting.  She has taken Effexor which caused her to feel jittery.  She used to take Ambien in the past but does not want to take it again.  She was agreeable to trying Vilazodone for depressive and anxiety symptoms.  She was also agreeable to trying temazepam for sleep.  She reported that her nightmares have improved remarkably with prazosin at bedtime. Patient was supposed to follow-up with me on November 30 however since we are switching to a different medicine would want to wait and postpone her follow-up appointment.  Patient agreed to be scheduling her appointment December 16 at 2:30 pm.  Potential side effects of medication and risks vs benefits of treatment vs non-treatment were explained and discussed. All questions were answered.  Patient informed that she is starting therapy with a therapist and her appointment is not till January.

## 2019-09-17 ENCOUNTER — Ambulatory Visit: Payer: Medicare Other | Admitting: Psychiatry

## 2019-10-03 ENCOUNTER — Ambulatory Visit (INDEPENDENT_AMBULATORY_CARE_PROVIDER_SITE_OTHER): Payer: Medicare Other | Admitting: Psychiatry

## 2019-10-03 ENCOUNTER — Other Ambulatory Visit: Payer: Self-pay

## 2019-10-03 ENCOUNTER — Encounter: Payer: Self-pay | Admitting: Psychiatry

## 2019-10-03 DIAGNOSIS — F3342 Major depressive disorder, recurrent, in full remission: Secondary | ICD-10-CM

## 2019-10-03 DIAGNOSIS — F4310 Post-traumatic stress disorder, unspecified: Secondary | ICD-10-CM

## 2019-10-03 MED ORDER — TEMAZEPAM 15 MG PO CAPS: 15.0000 mg | ORAL_CAPSULE | Freq: Every day | ORAL | 1 refills | Status: DC

## 2019-10-03 MED ORDER — VILAZODONE HCL 20 MG PO TABS: 20.0000 mg | ORAL_TABLET | Freq: Every day | ORAL | 1 refills | Status: DC

## 2019-10-03 MED ORDER — PRAZOSIN HCL 2 MG PO CAPS: 2.0000 mg | ORAL_CAPSULE | Freq: Every day | ORAL | 1 refills | Status: DC

## 2019-10-03 NOTE — Progress Notes (Signed)
BH MD OP Progress Note  I connected with  Bailey [name] on 10/03/19 by a video enabled telemedicine application and verified that I am speaking with the correct person using two identifiers.   I discussed the limitations of evaluation and management by telemedicine. The patient expressed understanding and agreed to proceed.    10/03/2019 1:08 PM Bailey [name]  MRN:  [id number]  Chief Complaint:  " I feel much better."  HPI: Patient reported doing much better on the current combination medications.  She stated that initially Viibryd was making her nauseous and eventually that went away.  She tried taking it at bedtime but that impacted her sleep so she has been taking it in the morning with breakfast.  She stated that she really likes it because it helps her feel like herself and does not feel emotionally numb.  She also informed she is sleeping very well with temazepam.  She stated that there have been several nights that she has not needed it at all.  Prazosin is working for nightmares. She feels she is in a better place.  She informed that she is happy that due to Covid people are not going out much now and she does not have to deal with her relatives.  She prefers to be at home with her children and avoids going out as much as possible.  Visit Diagnosis:    ICD-10-CM   1. PTSD (post-traumatic stress disorder)  F43.10   2. MDD (major depressive disorder), recurrent, in full remission (HCC)  F33.42     Past Psychiatric History: Depression, PTSD  Past Medical History:  Past Medical History:  Diagnosis Date  . Anemia   . Anxiety   . Asthma   . Bipolar disorder (HCC)   . Cough    chronic smoker   . Depression   . Headache   . PTSD (post-traumatic stress disorder)     Past Surgical History:  Procedure Laterality Date  . ABDOMINAL HYSTERECTOMY    . BREAST REDUCTION SURGERY  2004  . BREAST SURGERY    . CESAREAN SECTION    . CESAREAN SECTION    . GALLBLADDER SURGERY   [id number]  . HERNIA REPAIR    . LAPAROSCOPIC BILATERAL SALPINGECTOMY Right 08/28/2018   Procedure: LAPAROSCOPIC SALPINGECTOMY;  Surgeon: Ward, Elenora Fender, MD;  Location: [facility];  Service: Gynecology;  Laterality: Right;  . LAPAROSCOPIC HYSTERECTOMY N/A 08/28/2018   Procedure: HYSTERECTOMY TOTAL LAPAROSCOPIC;  Surgeon: Ward, Elenora Fender, MD;  Location: [facility];  Service: Gynecology;  Laterality: N/A;  . TONSILLECTOMY    . TONSILLECTOMY AND ADENOIDECTOMY  [id number]  . TUBAL LIGATION      Family Psychiatric History: See below  Family History:  Family History  Problem Relation Age of Onset  . Hypertension Mother   . Anxiety disorder Mother   . Depression Mother   . Anxiety disorder Father   . Depression Father   . Diabetes Maternal Aunt   . Diabetes Maternal Grandfather     Social History:  Social History   Socioeconomic History  . Marital status: Single    Spouse name: Not on file  . Number of children: Not on file  . Years of education: Not on file  . Highest education level: Not on file  Occupational History  . Not on file  Tobacco Use  . Smoking status: Current Some Day Smoker    Packs/day: 0.50    Types: Cigarettes    Start date: 05/05/2003  .  Smokeless tobacco: Never Used  Substance and Sexual Activity  . Alcohol use: Yes    Alcohol/week: 0.0 standard drinks    Comment: rare  . Drug use: No  . Sexual activity: Yes    Birth control/protection: I.U.D.  Other Topics Concern  . Not on file  Social History Narrative  . Not on file   Social Determinants of Health   Financial Resource Strain:   . Difficulty of Paying Living Expenses: Not on file  Food Insecurity:   . Worried About Programme researcher, broadcasting/film/videounning Out of Food in the Last Year: Not on file  . Ran Out of Food in the Last Year: Not on file  Transportation Needs:   . Lack of Transportation (Medical): Not on file  . Lack of Transportation (Non-Medical): Not on file  Physical Activity:   . Days of Exercise per Week: Not on  file  . Minutes of Exercise per Session: Not on file  Stress:   . Feeling of Stress : Not on file  Social Connections:   . Frequency of Communication with Friends and Family: Not on file  . Frequency of Social Gatherings with Friends and Family: Not on file  . Attends Religious Services: Not on file  . Active Member of Clubs or Organizations: Not on file  . Attends BankerClub or Organization Meetings: Not on file  . Marital Status: Not on file    Allergies:  Allergies  Allergen Reactions  . Sumatriptan Rash  . Hydrocodone-Acetaminophen Nausea Only  . Sumatriptan Succinate Rash    Metabolic Disorder Labs: No results found for: HGBA1C, MPG No results found for: PROLACTIN No results found for: CHOL, TRIG, HDL, CHOLHDL, VLDL, LDLCALC Lab Results  Component Value Date   TSH 2.290 07/29/2017    Therapeutic Level Labs: No results found for: LITHIUM No results found for: VALPROATE No components found for:  CBMZ  Current Medications: Current Outpatient Medications  Medication Sig Dispense Refill  . albuterol (PROAIR HFA) 108 (90 BASE) MCG/ACT inhaler Inhale 2 puffs into the lungs every 6 (six) hours as needed for wheezing or shortness of breath.     Marland Kitchen. amoxicillin-clavulanate (AUGMENTIN) 875-125 MG tablet Take 1 tablet by mouth every 12 (twelve) hours. 20 tablet 0  . benzonatate (TESSALON) 200 MG capsule Take one cap TID PRN cough 30 capsule 0  . cyclobenzaprine (FLEXERIL) 10 MG tablet Take 10 mg by mouth 2 (two) times daily as needed for muscle spasms.    . fluticasone (FLOVENT HFA) 110 MCG/ACT inhaler Inhale 2 puffs into the lungs daily as needed (for shortness of breath).     Marland Kitchen. guaiFENesin-codeine (CHERATUSSIN AC) 100-10 MG/5ML syrup Take 5 mLs by mouth 3 (three) times daily as needed for cough. 120 mL 0  . ibuprofen (ADVIL,MOTRIN) 800 MG tablet Take 1 tablet (800 mg total) by mouth every 8 (eight) hours as needed. 45 tablet 1  . prazosin (MINIPRESS) 2 MG capsule Take 1 capsule (2 mg  total) by mouth at bedtime. 30 capsule 1  . temazepam (RESTORIL) 15 MG capsule Take 1 capsule (15 mg total) by mouth at bedtime. 30 capsule 0  . Vilazodone HCl 20 MG TABS Take 1 tablet (20 mg total) by mouth daily. 30 tablet 1   No current facility-administered medications for this visit.    Musculoskeletal: Strength & Muscle Tone: unable to assess due to telemed visit Gait & Station: unable to assess due to telemed visit Patient leans: unable to assess due to telemed visit   Psychiatric Specialty  Exam: Review of Systems  There were no vitals taken for this visit.There is no height or weight on file to calculate BMI.  General Appearance: Fairly Groomed  Eye Contact:  Good  Speech:  Clear and Coherent and Normal Rate  Volume:  Normal  Mood:  Euthymic  Affect:  Congruent  Thought Process:  Goal Directed, Linear and Descriptions of Associations: Intact  Orientation:  Full (Time, Place, and Person)  Thought Content: Logical   Suicidal Thoughts:  No  Homicidal Thoughts:  No  Memory:  Recent;   Good Remote;   Good  Judgement:  Fair  Insight:  Fair  Psychomotor Activity:  Normal  Concentration:  Concentration: Good and Attention Span: Good  Recall:  Good  Fund of Knowledge: Good  Language: Good  Akathisia:  Negative  Handed:  Right  AIMS (if indicated): not done  Assets:  Communication Skills Desire for Improvement Financial Resources/Insurance Housing Social Support  ADL's:  Intact  Cognition: WNL  Sleep:  Good     Assessment and Plan: Patient appears to be doing well on her current regimen.  1. PTSD (post-traumatic stress disorder)  - prazosin (MINIPRESS) 2 MG capsule; Take 1 capsule (2 mg total) by mouth at bedtime.  Dispense: 30 capsule; Refill: 1 - Vilazodone HCl 20 MG TABS; Take 1 tablet (20 mg total) by mouth daily.  Dispense: 30 tablet; Refill: 1  2. MDD (major depressive disorder), recurrent, in full remission (HCC)  - Vilazodone HCl 20 MG TABS; Take 1  tablet (20 mg total) by mouth daily.  Dispense: 30 tablet; Refill: 1 - temazepam (RESTORIL) 15 MG capsule; Take 1 capsule (15 mg total) by mouth at bedtime.  Dispense: 30 capsule; Refill: 1  Has individual therapy appointment scheduled in January.   Follow-up in 2 months.   [place] Crane, MD 10/03/2019, 1:08 PM

## 2019-11-19 IMAGING — CR DG FINGER THUMB 2+V*L*
3 series · 4 of 4 positions shown · non-contrast
Comparison: None.

CLINICAL DATA: Acute thumb pain following injury yesterday. Initial
encounter.

EXAM:
LEFT THUMB 2+V

[finger ap]
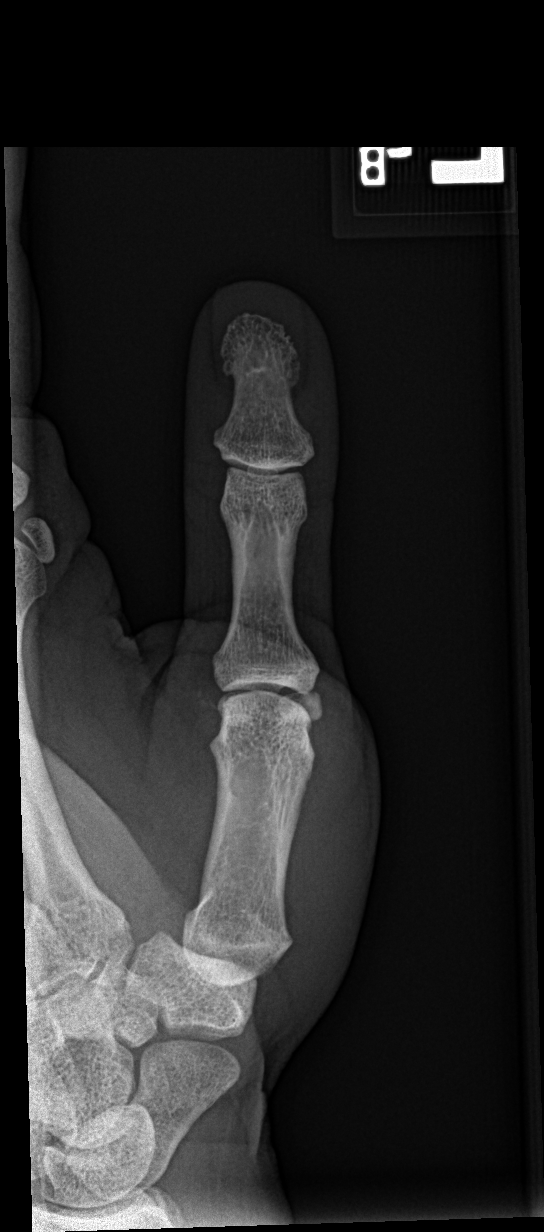

[finger obl]
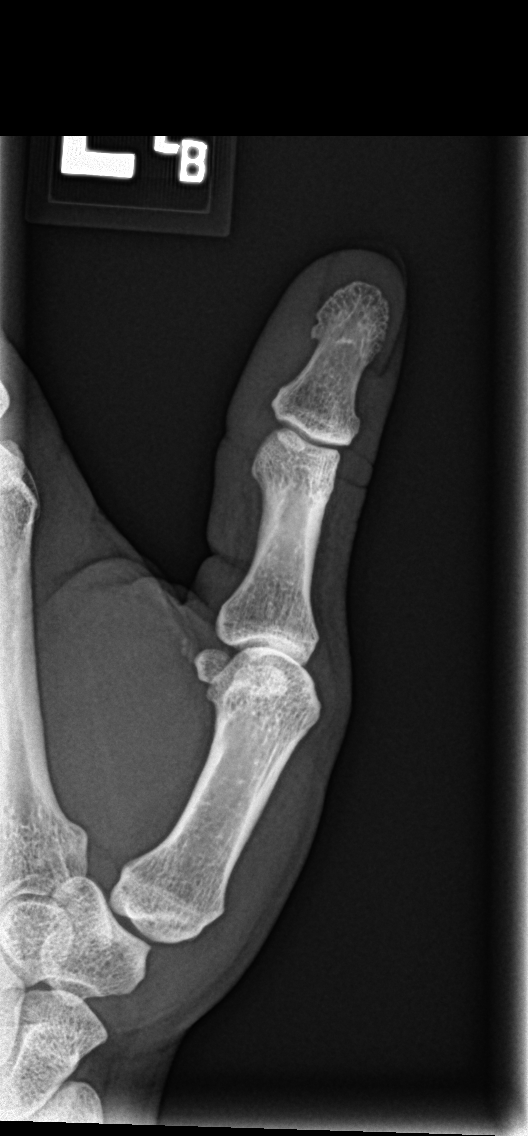

[Series 3: finger lat · 0.14mm/px · 2 of 2 slices shown]
[im 1/2]
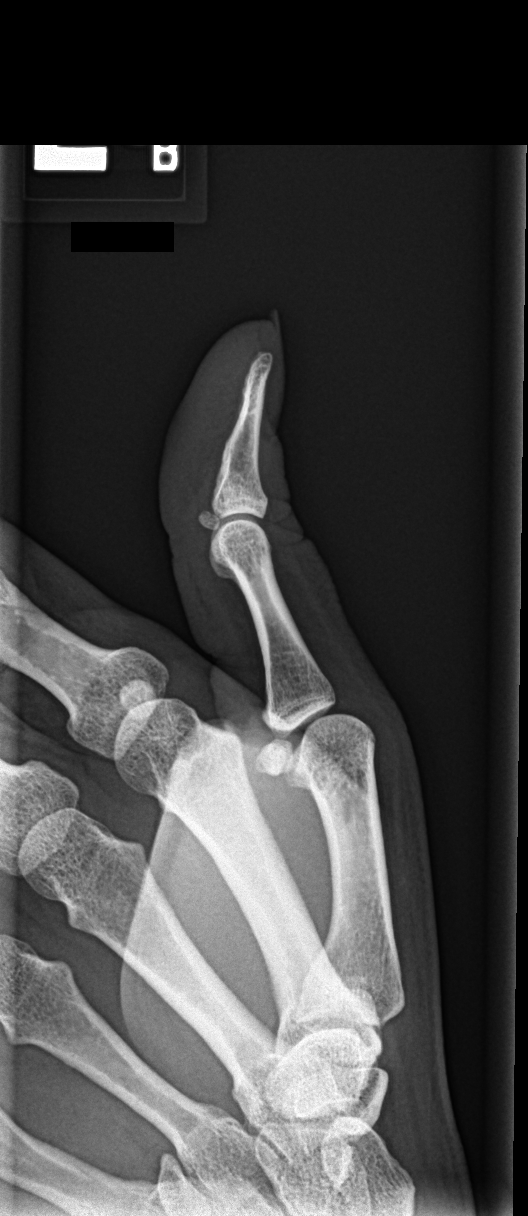
[im 2/2]
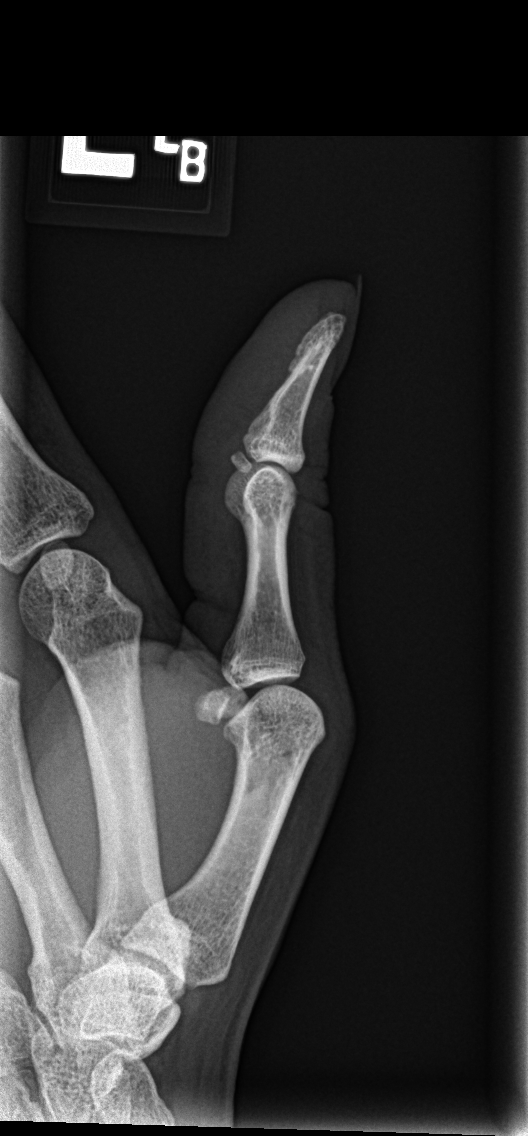

[4 of 4 positions shown; findings below may reference images not displayed]

FINDINGS: Three small faint ill-defined densities along the MEDIAL aspect of
the 1st MCP joint noted and may represent small avulsion
fracture/injury.

No other fracture, subluxation or dislocation identified.

No focal bony lesions are present.
IMPRESSION: Three small faint ill-defined densities along the 1st MCP joint -
question avulsion fracture/injury. Correlate with pain.

## 2019-11-30 ENCOUNTER — Other Ambulatory Visit: Payer: Self-pay

## 2019-11-30 ENCOUNTER — Encounter: Payer: Self-pay | Admitting: Psychiatry

## 2019-11-30 ENCOUNTER — Ambulatory Visit (INDEPENDENT_AMBULATORY_CARE_PROVIDER_SITE_OTHER): Payer: Medicare HMO | Admitting: Psychiatry

## 2019-11-30 DIAGNOSIS — F4310 Post-traumatic stress disorder, unspecified: Secondary | ICD-10-CM

## 2019-11-30 DIAGNOSIS — F3342 Major depressive disorder, recurrent, in full remission: Secondary | ICD-10-CM | POA: Diagnosis not present

## 2019-11-30 DIAGNOSIS — F331 Major depressive disorder, recurrent, moderate: Secondary | ICD-10-CM | POA: Diagnosis not present

## 2019-11-30 MED ORDER — VILAZODONE HCL 40 MG PO TABS: 40.0000 mg | ORAL_TABLET | Freq: Every day | ORAL | 1 refills | Status: DC

## 2019-11-30 MED ORDER — TEMAZEPAM 15 MG PO CAPS: 15.0000 mg | ORAL_CAPSULE | Freq: Every day | ORAL | 1 refills | Status: DC

## 2019-11-30 MED ORDER — PRAZOSIN HCL 2 MG PO CAPS: 2.0000 mg | ORAL_CAPSULE | Freq: Every day | ORAL | 1 refills | Status: DC
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
## 2019-12-28 ENCOUNTER — Other Ambulatory Visit: Payer: Self-pay

## 2019-12-28 ENCOUNTER — Ambulatory Visit (INDEPENDENT_AMBULATORY_CARE_PROVIDER_SITE_OTHER): Payer: Medicare HMO | Admitting: Psychiatry

## 2019-12-28 ENCOUNTER — Encounter: Payer: Self-pay | Admitting: Psychiatry

## 2019-12-28 DIAGNOSIS — F331 Major depressive disorder, recurrent, moderate: Secondary | ICD-10-CM | POA: Diagnosis not present

## 2019-12-28 DIAGNOSIS — F4310 Post-traumatic stress disorder, unspecified: Secondary | ICD-10-CM | POA: Diagnosis not present

## 2019-12-28 MED ORDER — VILAZODONE HCL 40 MG PO TABS: 40.0000 mg | ORAL_TABLET | Freq: Every day | ORAL | 1 refills | Status: DC

## 2019-12-28 MED ORDER — REXULTI 0.5 MG PO TABS: 0.5000 mg | ORAL_TABLET | Freq: Every day | ORAL | 1 refills | Status: DC

## 2019-12-28 MED ORDER — PRAZOSIN HCL 2 MG PO CAPS: 2.0000 mg | ORAL_CAPSULE | Freq: Every day | ORAL | 1 refills | Status: DC

## 2019-12-28 MED ORDER — TEMAZEPAM 15 MG PO CAPS: 15.0000 mg | ORAL_CAPSULE | Freq: Every day | ORAL | 1 refills | Status: DC
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
## 2020-01-25 ENCOUNTER — Other Ambulatory Visit: Payer: Self-pay

## 2020-01-25 ENCOUNTER — Encounter: Payer: Self-pay | Admitting: Psychiatry

## 2020-01-25 ENCOUNTER — Ambulatory Visit (INDEPENDENT_AMBULATORY_CARE_PROVIDER_SITE_OTHER): Payer: Medicare HMO | Admitting: Psychiatry

## 2020-01-25 DIAGNOSIS — F331 Major depressive disorder, recurrent, moderate: Secondary | ICD-10-CM | POA: Diagnosis not present

## 2020-01-25 DIAGNOSIS — F4310 Post-traumatic stress disorder, unspecified: Secondary | ICD-10-CM

## 2020-01-25 MED ORDER — TEMAZEPAM 15 MG PO CAPS: 15.0000 mg | ORAL_CAPSULE | Freq: Every day | ORAL | 1 refills | Status: DC

## 2020-01-25 MED ORDER — REXULTI 0.5 MG PO TABS: 0.5000 mg | ORAL_TABLET | Freq: Every day | ORAL | 1 refills | Status: DC

## 2020-01-25 MED ORDER — VILAZODONE HCL 40 MG PO TABS: 40.0000 mg | ORAL_TABLET | Freq: Every day | ORAL | 1 refills | Status: DC

## 2020-01-25 MED ORDER — PRAZOSIN HCL 2 MG PO CAPS: 2.0000 mg | ORAL_CAPSULE | Freq: Every day | ORAL | 1 refills | Status: DC

## 2020-01-25 NOTE — Progress Notes (Signed)
BH MD OP Progress Note  I connected with  Bailey [name] on 01/25/20 by a video enabled telemedicine application and verified that I am speaking with the correct person using two identifiers.   I discussed the limitations of evaluation and management by telemedicine. The patient expressed understanding and agreed to proceed.    01/25/2020 9:59 AM Bailey [name]  MRN:  [id number]  Chief Complaint:  " I am dealing with one situation after another."  HPI: Patient stated that she is feeling better than she was last month.  She stated that starting Rexulti has helped her process things one by one.  She feels it has definitely helped her mood to some extent.  She stated that she just has so much circumstantial stress that she does not know what to do sometimes.  She stated that she had been seeing her son regularly every other weekend while he keeps living with her mother.  However he recently started travel ball.  And he would be traveling on the weekends that he was supposed to be seeing her.  Although patient is happy that her son would be doing something positive and would be happy and what he is doing.  However she is also stressed about the fact that she would not able to see him as much as she would like to.  She stated that she has a difficult relationship with her mother and talking to her about making some other arrangements would be very hard. Patient stated that she is trying to process everything one by one and she feels that Rexulti is helpful with that.  She is going to work on herself and then approach her mother regarding the whole situation without escalating it into another conflict.  She informed that her mother will be traveling with her son for his travel ball sessions. Patient stated that she certainly feels better than she was a few weeks ago.  She stated that today she woke up in had strong willed to clean up her house and she has been doing that.  She feels good about doing that.   She would like to continue the same regimen and continue the monthly follow-ups.   Visit Diagnosis:    ICD-10-CM   1. MDD (major depressive disorder), recurrent episode, moderate (HCC)  F33.1 Brexpiprazole (REXULTI) 0.5 MG TABS    temazepam (RESTORIL) 15 MG capsule    Vilazodone HCl (VIIBRYD) 40 MG TABS  2. PTSD (post-traumatic stress disorder)  F43.10 prazosin (MINIPRESS) 2 MG capsule    Past Psychiatric History: Depression, PTSD  Past Medical History:  Past Medical History:  Diagnosis Date  . Anemia   . Anxiety   . Asthma   . Bipolar disorder (HCC)   . Cough    chronic smoker   . Depression   . Headache   . PTSD (post-traumatic stress disorder)     Past Surgical History:  Procedure Laterality Date  . ABDOMINAL HYSTERECTOMY    . BREAST REDUCTION SURGERY  2004  . BREAST SURGERY    . CESAREAN SECTION    . CESAREAN SECTION    . GALLBLADDER SURGERY  [id number]  . HERNIA REPAIR    . LAPAROSCOPIC BILATERAL SALPINGECTOMY Right 08/28/2018   Procedure: LAPAROSCOPIC SALPINGECTOMY;  Surgeon: Ward, Elenora Fender, MD;  Location: [facility];  Service: Gynecology;  Laterality: Right;  . LAPAROSCOPIC HYSTERECTOMY N/A 08/28/2018   Procedure: HYSTERECTOMY TOTAL LAPAROSCOPIC;  Surgeon: Ward, Elenora Fender, MD;  Location: [facility];  Service: Gynecology;  Laterality: N/A;  . TONSILLECTOMY    . TONSILLECTOMY AND ADENOIDECTOMY  [id number]  . TUBAL LIGATION      Family Psychiatric History: See below  Family History:  Family History  Problem Relation Age of Onset  . Hypertension Mother   . Anxiety disorder Mother   . Depression Mother   . Anxiety disorder Father   . Depression Father   . Diabetes Maternal Aunt   . Diabetes Maternal Grandfather     Social History:  Social History   Socioeconomic History  . Marital status: Single    Spouse name: Not on file  . Number of children: Not on file  . Years of education: Not on file  . Highest education level: Not on file  Occupational History   . Not on file  Tobacco Use  . Smoking status: Current Some Day Smoker    Packs/day: 0.50    Types: Cigarettes    Start date: 05/05/2003  . Smokeless tobacco: Never Used  Substance and Sexual Activity  . Alcohol use: Yes    Alcohol/week: 0.0 standard drinks    Comment: rare  . Drug use: No  . Sexual activity: Yes    Birth control/protection: I.U.D.  Other Topics Concern  . Not on file  Social History Narrative  . Not on file   Social Determinants of Health   Financial Resource Strain:   . Difficulty of Paying Living Expenses:   Food Insecurity:   . Worried About Programme researcher, broadcasting/film/video in the Last Year:   . Barista in the Last Year:   Transportation Needs:   . Freight forwarder (Medical):   Marland Kitchen Lack of Transportation (Non-Medical):   Physical Activity:   . Days of Exercise per Week:   . Minutes of Exercise per Session:   Stress:   . Feeling of Stress :   Social Connections:   . Frequency of Communication with Friends and Family:   . Frequency of Social Gatherings with Friends and Family:   . Attends Religious Services:   . Active Member of Clubs or Organizations:   . Attends Banker Meetings:   Marland Kitchen Marital Status:     Allergies:  Allergies  Allergen Reactions  . Sumatriptan Rash  . Hydrocodone-Acetaminophen Nausea Only  . Sumatriptan Succinate Rash    Metabolic Disorder Labs: No results found for: HGBA1C, MPG No results found for: PROLACTIN No results found for: CHOL, TRIG, HDL, CHOLHDL, VLDL, LDLCALC Lab Results  Component Value Date   TSH 2.290 07/29/2017    Therapeutic Level Labs: No results found for: LITHIUM No results found for: VALPROATE No components found for:  CBMZ  Current Medications: Current Outpatient Medications  Medication Sig Dispense Refill  . albuterol (PROAIR HFA) 108 (90 BASE) MCG/ACT inhaler Inhale 2 puffs into the lungs every 6 (six) hours as needed for wheezing or shortness of breath.     Marland Kitchen  amoxicillin-clavulanate (AUGMENTIN) 875-125 MG tablet Take 1 tablet by mouth every 12 (twelve) hours. 20 tablet 0  . benzonatate (TESSALON) 200 MG capsule Take one cap TID PRN cough 30 capsule 0  . Brexpiprazole (REXULTI) 0.5 MG TABS Take 1 tablet (0.5 mg total) by mouth daily. 30 tablet 1  . cyclobenzaprine (FLEXERIL) 10 MG tablet Take 10 mg by mouth 2 (two) times daily as needed for muscle spasms.    . fluticasone (FLOVENT HFA) 110 MCG/ACT inhaler Inhale 2 puffs into the lungs daily as needed (for shortness of breath).     Marland Kitchen  guaiFENesin-codeine (CHERATUSSIN AC) 100-10 MG/5ML syrup Take 5 mLs by mouth 3 (three) times daily as needed for cough. 120 mL 0  . ibuprofen (ADVIL,MOTRIN) 800 MG tablet Take 1 tablet (800 mg total) by mouth every 8 (eight) hours as needed. 45 tablet 1  . prazosin (MINIPRESS) 2 MG capsule Take 1 capsule (2 mg total) by mouth at bedtime. 30 capsule 1  . temazepam (RESTORIL) 15 MG capsule Take 1 capsule (15 mg total) by mouth at bedtime. 30 capsule 1  . Vilazodone HCl (VIIBRYD) 40 MG TABS Take 1 tablet (40 mg total) by mouth daily. 30 tablet 1   No current facility-administered medications for this visit.    Musculoskeletal: Strength & Muscle Tone: unable to assess due to telemed visit Gait & Station: unable to assess due to telemed visit Patient leans: unable to assess due to telemed visit   Psychiatric Specialty Exam: Review of Systems  There were no vitals taken for this visit.There is no height or weight on file to calculate BMI.  General Appearance: Fairly Groomed  Eye Contact:  Good  Speech:  Clear and Coherent and Normal Rate  Volume:  Normal  Mood:  Less depressed  Affect: Less  Depressed  Thought Process:  Goal Directed, Linear and Descriptions of Associations: Intact  Orientation:  Full (Time, Place, and Person)  Thought Content: Logical   Suicidal Thoughts:  No  Homicidal Thoughts:  No  Memory:  Recent;   Good Remote;   Good  Judgement:  Fair   Insight:  Fair  Psychomotor Activity:  Normal  Concentration:  Concentration: Good and Attention Span: Good  Recall:  Good  Fund of Knowledge: Good  Language: Good  Akathisia:  Negative  Handed:  Right  AIMS (if indicated): not done  Assets:  Communication Skills Desire for Improvement Financial Resources/Insurance Housing Social Support  ADL's:  Intact  Cognition: WNL  Sleep:  Fair     Assessment and Plan: Patient patient is still not doing as well as she should be.  She still has ongoing depressive symptoms with stress and worry and frequent panic attacks.  She was offered Rexulti as an adjunctive medicine with Viibryd to target her depressive symptoms. Potential side effects of medication and risks vs benefits of treatment vs non-treatment were explained and discussed. All questions were answered. Patient was agreeable to give it a try.   1. MDD (major depressive disorder), recurrent episode, moderate (HCC)  - temazepam (RESTORIL) 15 MG capsule; Take 1 capsule (15 mg total) by mouth at bedtime.  Dispense: 30 capsule; Refill: 1 - Vilazodone HCl (VIIBRYD) 40 MG TABS; Take 1 tablet (40 mg total) by mouth daily.  Dispense: 30 tablet; Refill: 1 -Start brexpiprazole (REXULTI) 0.5 MG TABS; Take 1 tablet (0.5 mg total) by mouth daily.  Dispense: 30 tablet; Refill: 1  2. PTSD (post-traumatic stress disorder)  - prazosin (MINIPRESS) 2 MG capsule; Take 1 capsule (2 mg total) by mouth at bedtime.  Dispense: 30 capsule; Refill: 1   Continue same regimen. Follow-up in 4 weeks.   Nevada Crane, MD 01/25/2020, 9:59 AM

## 2020-01-31 ENCOUNTER — Telehealth: Payer: Self-pay

## 2020-01-31 NOTE — Telephone Encounter (Signed)
went online and submitted the prior auth for viibryd 4omg - pending review.

## 2020-01-31 NOTE — Telephone Encounter (Signed)
received email that prior auth was needed for viibryd 40mg  .

## 2020-02-01 NOTE — Telephone Encounter (Signed)
Received notice from covermymeds that the viibry40mg  was approved.

## 2020-02-12 ENCOUNTER — Telehealth: Payer: Self-pay

## 2020-02-12 NOTE — Telephone Encounter (Signed)
refceived a fax that a prior auth was needed for rexulti. 0.5mg  - prior Berkley Harvey was submitted and pending.

## 2020-02-12 NOTE — Telephone Encounter (Signed)
PA Case: [id number], Status: Approved, Coverage Starts on: 10/19/2019 12:00:00 AM, Coverage Ends on: 10/17/2020

## 2020-02-22 ENCOUNTER — Telehealth: Payer: Medicare HMO | Admitting: Psychiatry

## 2020-02-22 ENCOUNTER — Other Ambulatory Visit: Payer: Self-pay

## 2020-04-03 ENCOUNTER — Encounter (HOSPITAL_COMMUNITY): Payer: Self-pay | Admitting: Psychiatry

## 2020-04-03 ENCOUNTER — Telehealth (INDEPENDENT_AMBULATORY_CARE_PROVIDER_SITE_OTHER): Payer: Medicare HMO | Admitting: Psychiatry

## 2020-04-03 ENCOUNTER — Other Ambulatory Visit: Payer: Self-pay

## 2020-04-03 DIAGNOSIS — F4310 Post-traumatic stress disorder, unspecified: Secondary | ICD-10-CM

## 2020-04-03 DIAGNOSIS — F331 Major depressive disorder, recurrent, moderate: Secondary | ICD-10-CM | POA: Diagnosis not present

## 2020-04-03 MED ORDER — VILAZODONE HCL 40 MG PO TABS: 40.0000 mg | ORAL_TABLET | Freq: Every day | ORAL | 1 refills | Status: DC

## 2020-04-03 MED ORDER — TEMAZEPAM 15 MG PO CAPS: 15.0000 mg | ORAL_CAPSULE | Freq: Every day | ORAL | 1 refills | Status: DC
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
## 2020-05-06 ENCOUNTER — Other Ambulatory Visit: Payer: Self-pay

## 2020-05-06 ENCOUNTER — Telehealth (INDEPENDENT_AMBULATORY_CARE_PROVIDER_SITE_OTHER): Payer: Medicare HMO | Admitting: Psychiatry

## 2020-05-06 ENCOUNTER — Encounter (HOSPITAL_COMMUNITY): Payer: Self-pay | Admitting: Psychiatry

## 2020-05-06 DIAGNOSIS — F331 Major depressive disorder, recurrent, moderate: Secondary | ICD-10-CM

## 2020-05-06 DIAGNOSIS — F4310 Post-traumatic stress disorder, unspecified: Secondary | ICD-10-CM | POA: Diagnosis not present

## 2020-05-06 MED ORDER — VILAZODONE HCL 40 MG PO TABS: 40.0000 mg | ORAL_TABLET | Freq: Every day | ORAL | 1 refills | Status: DC

## 2020-05-06 MED ORDER — TEMAZEPAM 15 MG PO CAPS: 15.0000 mg | ORAL_CAPSULE | Freq: Every day | ORAL | 1 refills | Status: DC
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
## 2020-06-12 ENCOUNTER — Telehealth (HOSPITAL_COMMUNITY): Payer: Self-pay | Admitting: *Deleted

## 2020-06-12 MED ORDER — CLONAZEPAM 0.5 MG PO TABS
0.5000 mg | ORAL_TABLET | Freq: Two times a day (BID) | ORAL | 0 refills | Status: DC
Start: 2020-06-12 — End: 2020-10-07
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
## 2020-06-12 NOTE — Addendum Note (Signed)
Addended by: Zena Amos on: 06/12/2020 01:04 PM   Modules accepted: Orders
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
## 2020-06-19 ENCOUNTER — Telehealth (INDEPENDENT_AMBULATORY_CARE_PROVIDER_SITE_OTHER): Payer: Medicare HMO | Admitting: Psychiatry

## 2020-06-19 ENCOUNTER — Other Ambulatory Visit: Payer: Self-pay

## 2020-06-19 ENCOUNTER — Encounter (HOSPITAL_COMMUNITY): Payer: Self-pay | Admitting: Psychiatry

## 2020-06-19 DIAGNOSIS — F331 Major depressive disorder, recurrent, moderate: Secondary | ICD-10-CM | POA: Diagnosis not present

## 2020-06-19 DIAGNOSIS — F4310 Post-traumatic stress disorder, unspecified: Secondary | ICD-10-CM

## 2020-06-19 MED ORDER — TEMAZEPAM 15 MG PO CAPS: 15.0000 mg | ORAL_CAPSULE | Freq: Every day | ORAL | 1 refills | Status: DC

## 2020-06-19 MED ORDER — VILAZODONE HCL 40 MG PO TABS: 40.0000 mg | ORAL_TABLET | Freq: Every day | ORAL | 1 refills | Status: DC
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
## 2020-07-31 ENCOUNTER — Encounter (HOSPITAL_COMMUNITY): Payer: Self-pay | Admitting: Psychiatry

## 2020-07-31 ENCOUNTER — Other Ambulatory Visit: Payer: Self-pay

## 2020-07-31 ENCOUNTER — Telehealth (INDEPENDENT_AMBULATORY_CARE_PROVIDER_SITE_OTHER): Payer: Medicare HMO | Admitting: Psychiatry

## 2020-07-31 DIAGNOSIS — F331 Major depressive disorder, recurrent, moderate: Secondary | ICD-10-CM | POA: Diagnosis not present

## 2020-07-31 DIAGNOSIS — F4310 Post-traumatic stress disorder, unspecified: Secondary | ICD-10-CM | POA: Diagnosis not present

## 2020-07-31 MED ORDER — TEMAZEPAM 15 MG PO CAPS: 15.0000 mg | ORAL_CAPSULE | Freq: Every day | ORAL | 1 refills | Status: DC

## 2020-07-31 MED ORDER — VILAZODONE HCL 40 MG PO TABS: 40.0000 mg | ORAL_TABLET | Freq: Every day | ORAL | 1 refills | Status: DC
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
## 2020-09-08 ENCOUNTER — Other Ambulatory Visit: Payer: Self-pay

## 2020-09-08 ENCOUNTER — Telehealth (HOSPITAL_COMMUNITY): Payer: Medicare HMO | Admitting: Psychiatry

## 2020-10-07 ENCOUNTER — Telehealth (INDEPENDENT_AMBULATORY_CARE_PROVIDER_SITE_OTHER): Payer: Medicare HMO | Admitting: Psychiatry

## 2020-10-07 ENCOUNTER — Other Ambulatory Visit: Payer: Self-pay

## 2020-10-07 ENCOUNTER — Encounter (HOSPITAL_COMMUNITY): Payer: Self-pay | Admitting: Psychiatry

## 2020-10-07 DIAGNOSIS — F4310 Post-traumatic stress disorder, unspecified: Secondary | ICD-10-CM

## 2020-10-07 DIAGNOSIS — F331 Major depressive disorder, recurrent, moderate: Secondary | ICD-10-CM | POA: Diagnosis not present

## 2020-10-07 MED ORDER — VILAZODONE HCL 40 MG PO TABS: 40.0000 mg | ORAL_TABLET | Freq: Every day | ORAL | 1 refills | Status: DC

## 2020-10-07 MED ORDER — CLONAZEPAM 0.5 MG PO TABS: 0.5000 mg | ORAL_TABLET | Freq: Two times a day (BID) | ORAL | 0 refills | Status: DC

## 2020-10-07 MED ORDER — TEMAZEPAM 15 MG PO CAPS: 15.0000 mg | ORAL_CAPSULE | Freq: Every day | ORAL | 1 refills | Status: DC
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
## 2020-11-18 ENCOUNTER — Encounter (HOSPITAL_COMMUNITY): Payer: Self-pay | Admitting: Psychiatry

## 2020-11-18 ENCOUNTER — Telehealth (INDEPENDENT_AMBULATORY_CARE_PROVIDER_SITE_OTHER): Payer: Medicare HMO | Admitting: Psychiatry

## 2020-11-18 ENCOUNTER — Other Ambulatory Visit: Payer: Self-pay

## 2020-11-18 DIAGNOSIS — F4310 Post-traumatic stress disorder, unspecified: Secondary | ICD-10-CM

## 2020-11-18 DIAGNOSIS — F3341 Major depressive disorder, recurrent, in partial remission: Secondary | ICD-10-CM

## 2020-11-18 MED ORDER — TEMAZEPAM 15 MG PO CAPS: 15.0000 mg | ORAL_CAPSULE | Freq: Every day | ORAL | 1 refills | Status: DC

## 2020-11-18 MED ORDER — CLONAZEPAM 0.5 MG PO TABS: 0.5000 mg | ORAL_TABLET | Freq: Two times a day (BID) | ORAL | 0 refills | Status: DC | PRN

## 2020-11-18 MED ORDER — VILAZODONE HCL 40 MG PO TABS: 40.0000 mg | ORAL_TABLET | Freq: Every day | ORAL | 1 refills | Status: DC

## 2020-11-18 NOTE — Progress Notes (Signed)
BH MD OP Progress Note  Virtual Visit via Video Note  I connected with Bailey [name] on 06/19/20 at 10:40 AM EDT by a video enabled telemedicine application and verified that I am speaking with the correct person using two identifiers.  Location: Patient: Home Provider: Clinic   I discussed the limitations of evaluation and management by telemedicine and the availability of in person appointments. The patient expressed understanding and agreed to proceed.  I provided 17 minutes of non-face-to-face time during this encounter.    06/19/2020 10:29 AM Bailey [name]  MRN:  [id number]  Chief Complaint:  " We had a good Christmas."  HPI: Pt reported that she had a good Christmas with her children and fiance. She was also able to see her brother. She stated that her mom continues to be mean towards her and last week and she along with her step dad sent her texts stating that she is a bad mother who has never done anything for her son. Her son recently turned 15 and she was able to spend some time with him on that day which she really cherished.  She stated that her maternal aunt has also stopped talking to her mother recently which the pt found to be empowering. She stated that she is trying to work on herself taking one day at a time and not letting her mom's feelings towards her impact her.  Writer provided her with supportive therapy during the session.  Visit Diagnosis:    ICD-10-CM   1. MDD (major depressive disorder), recurrent episode, moderate (HCC)  F33.1   2. PTSD (post-traumatic stress disorder)  F43.10     Past Psychiatric History: Depression, PTSD  Past Medical History:  Past Medical History:  Diagnosis Date  . Anemia   . Anxiety   . Asthma   . Bipolar disorder (HCC)   . Cough    chronic smoker   . Depression   . Headache   . PTSD (post-traumatic stress disorder)     Past Surgical History:  Procedure Laterality Date  . ABDOMINAL HYSTERECTOMY    . BREAST  REDUCTION SURGERY  2004  . BREAST SURGERY    . CESAREAN SECTION    . CESAREAN SECTION    . GALLBLADDER SURGERY  [id number]  . HERNIA REPAIR    . LAPAROSCOPIC BILATERAL SALPINGECTOMY Right 08/28/2018   Procedure: LAPAROSCOPIC SALPINGECTOMY;  Surgeon: Ward, Elenora Fender, MD;  Location: [facility];  Service: Gynecology;  Laterality: Right;  . LAPAROSCOPIC HYSTERECTOMY N/A 08/28/2018   Procedure: HYSTERECTOMY TOTAL LAPAROSCOPIC;  Surgeon: Ward, Elenora Fender, MD;  Location: [facility];  Service: Gynecology;  Laterality: N/A;  . TONSILLECTOMY    . TONSILLECTOMY AND ADENOIDECTOMY  [id number]  . TUBAL LIGATION      Family Psychiatric History: See below  Family History:  Family History  Problem Relation Age of Onset  . Hypertension Mother   . Anxiety disorder Mother   . Depression Mother   . Anxiety disorder Father   . Depression Father   . Diabetes Maternal Aunt   . Diabetes Maternal Grandfather     Social History:  Social History   Socioeconomic History  . Marital status: Single    Spouse name: Not on file  . Number of children: Not on file  . Years of education: Not on file  . Highest education level: Not on file  Occupational History  . Not on file  Tobacco Use  . Smoking status: Current Some Day Smoker  Packs/day: 0.50    Types: Cigarettes    Start date: 05/05/2003  . Smokeless tobacco: Never Used  Vaping Use  . Vaping Use: Never used  Substance and Sexual Activity  . Alcohol use: Yes    Alcohol/week: 0.0 standard drinks    Comment: rare  . Drug use: No  . Sexual activity: Yes    Birth control/protection: I.U.D.  Other Topics Concern  . Not on file  Social History Narrative  . Not on file   Social Determinants of Health   Financial Resource Strain:   . Difficulty of Paying Living Expenses: Not on file  Food Insecurity:   . Worried About Programme researcher, broadcasting/film/video in the Last Year: Not on file  . Ran Out of Food in the Last Year: Not on file  Transportation Needs:   . Lack  of Transportation (Medical): Not on file  . Lack of Transportation (Non-Medical): Not on file  Physical Activity:   . Days of Exercise per Week: Not on file  . Minutes of Exercise per Session: Not on file  Stress:   . Feeling of Stress : Not on file  Social Connections:   . Frequency of Communication with Friends and Family: Not on file  . Frequency of Social Gatherings with Friends and Family: Not on file  . Attends Religious Services: Not on file  . Active Member of Clubs or Organizations: Not on file  . Attends Banker Meetings: Not on file  . Marital Status: Not on file    Allergies:  Allergies  Allergen Reactions  . Sumatriptan Rash  . Hydrocodone-Acetaminophen Nausea Only  . Sumatriptan Succinate Rash    Metabolic Disorder Labs: No results found for: HGBA1C, MPG No results found for: PROLACTIN No results found for: CHOL, TRIG, HDL, CHOLHDL, VLDL, LDLCALC Lab Results  Component Value Date   TSH 2.290 07/29/2017    Therapeutic Level Labs: No results found for: LITHIUM No results found for: VALPROATE No components found for:  CBMZ  Current Medications: Current Outpatient Medications  Medication Sig Dispense Refill  . albuterol (PROAIR HFA) 108 (90 BASE) MCG/ACT inhaler Inhale 2 puffs into the lungs every 6 (six) hours as needed for wheezing or shortness of breath.     . clonazePAM (KLONOPIN) 0.5 MG tablet Take 1 tablet (0.5 mg total) by mouth 2 (two) times daily. 60 tablet 0  . cyclobenzaprine (FLEXERIL) 10 MG tablet Take 10 mg by mouth 2 (two) times daily as needed for muscle spasms.    . fluticasone (FLOVENT HFA) 110 MCG/ACT inhaler Inhale 2 puffs into the lungs daily as needed (for shortness of breath).     Marland Kitchen ibuprofen (ADVIL,MOTRIN) 800 MG tablet Take 1 tablet (800 mg total) by mouth every 8 (eight) hours as needed. 45 tablet 1  . temazepam (RESTORIL) 15 MG capsule Take 1 capsule (15 mg total) by mouth at bedtime. 30 capsule 1  . Vilazodone HCl  (VIIBRYD) 40 MG TABS Take 1 tablet (40 mg total) by mouth daily. 30 tablet 1   No current facility-administered medications for this visit.    Musculoskeletal: Strength & Muscle Tone: unable to assess due to telemed visit Gait & Station: unable to assess due to telemed visit Patient leans: unable to assess due to telemed visit   Psychiatric Specialty Exam: Review of Systems  There were no vitals taken for this visit.There is no height or weight on file to calculate BMI.  General Appearance: Fairly Groomed  Eye Contact:  Good  Speech:  Clear and Coherent and Normal Rate  Volume:  Normal  Mood: Less Depressed  Affect: Less  depressed  Thought Process:  Goal Directed, Linear and Descriptions of Associations: Intact  Orientation:  Full (Time, Place, and Person)  Thought Content: Logical   Suicidal Thoughts:  No  Homicidal Thoughts:  No  Memory:  Recent;   Good Remote;   Good  Judgement:  Fair  Insight:  Fair  Psychomotor Activity:  Normal  Concentration:  Concentration: Good and Attention Span: Good  Recall:  Good  Fund of Knowledge: Good  Language: Good  Akathisia:  Negative  Handed:  Right  AIMS (if indicated): not done  Assets:  Communication Skills Desire for Improvement Financial Resources/Insurance Housing Social Support  ADL's:  Intact  Cognition: WNL  Sleep:  Fair     Assessment and Plan: Pt appeared to be less depressed today. She is continuing to deal with the stressful her mother.  1. MDD (major depressive disorder), recurrent episode, moderate (HCC)  - Vilazodone HCl (VIIBRYD) 40 MG TABS; Take 1 tablet (40 mg total) by mouth daily.  Dispense: 30 tablet; Refill: 1 - temazepam (RESTORIL) 15 MG capsule; Take 1 capsule (15 mg total) by mouth at bedtime.  Dispense: 30 capsule; Refill: 1  2. PTSD (post-traumatic stress disorder)  - Vilazodone HCl (VIIBRYD) 40 MG TABS; Take 1 tablet (40 mg total) by mouth daily.  Dispense: 30 tablet; Refill: 1 - Clonazepam  0.5 mg PRN for breakthrough anxiety and panic attack. Does not use it more than 2 times per week.  Continue the same regimen. Follow-up in 2 months.    Zena Amos, MD 06/19/2020, 10:29 AM

## 2021-01-15 ENCOUNTER — Telehealth (INDEPENDENT_AMBULATORY_CARE_PROVIDER_SITE_OTHER): Payer: Medicare HMO | Admitting: Psychiatry

## 2021-01-15 ENCOUNTER — Encounter (HOSPITAL_COMMUNITY): Payer: Self-pay | Admitting: Psychiatry

## 2021-01-15 ENCOUNTER — Other Ambulatory Visit: Payer: Self-pay

## 2021-01-15 DIAGNOSIS — F3341 Major depressive disorder, recurrent, in partial remission: Secondary | ICD-10-CM | POA: Diagnosis not present

## 2021-01-15 DIAGNOSIS — F4310 Post-traumatic stress disorder, unspecified: Secondary | ICD-10-CM | POA: Diagnosis not present

## 2021-01-15 MED ORDER — VILAZODONE HCL 40 MG PO TABS: 40.0000 mg | ORAL_TABLET | Freq: Every day | ORAL | 1 refills | Status: DC

## 2021-01-15 MED ORDER — TEMAZEPAM 15 MG PO CAPS
15.0000 mg | ORAL_CAPSULE | Freq: Every day | ORAL | 1 refills | Status: DC
Start: 2021-01-15 — End: 2021-03-04

## 2021-01-15 MED ORDER — CLONAZEPAM 0.5 MG PO TABS
0.5000 mg | ORAL_TABLET | Freq: Two times a day (BID) | ORAL | 0 refills | Status: DC | PRN
Start: 2021-01-15 — End: 2021-03-04

## 2021-01-15 NOTE — Progress Notes (Signed)
BH MD OP Progress Note  Virtual Visit via Video Note  I connected with Bailey [name] on 01/15/21 at 10:00 AM EDT by a video enabled telemedicine application and verified that I am speaking with the correct person using two identifiers.  Location: Patient: Home Provider: Clinic   I discussed the limitations of evaluation and management by telemedicine and the availability of in person appointments. The patient expressed understanding and agreed to proceed.  I provided 18 minutes of non-face-to-face time during this encounter.    06/19/2020 10:29 AM Bailey [name]  MRN:  [id number]  Chief Complaint:  " I am frustrated."  HPI: Patient reported that she is frustrated today because her mother and stepfather are making things difficult for her once again.  She stated that a few weeks ago once again she had a big argument with her mother over her son's visitation days and times with her.  Eventually her stepfather stated that from that point onwards patient left to go through him for any communication.  Since then she has been texting her stepdad for anything pertaining to her son. She stated that she recently was informed by him that patient's basketball game showings will be every other weekend and those weekends are coinciding with his visitation days with the patient.  Patient stated that she wrote a long text to him this past weekend suggesting that maybe they can switch her alternate weekends so that he can be with her on the weekends that he does not have his basketball games. She stated that she sent a text 4 days ago and she has not heard back from them and she is very upset about that. She stated that she is trying to keep her cool but it is really getting hard.  She stated that her fianc as well as good friend has told her not to take any extreme steps because this is probably what her step dad and mother want.  Writer also recommended that this seems like another set up on their part  to have the patient retaliate and then cause more problems for her. Patient verbalized understanding but expressed her frustration in dealing with this.  She stated that she can believe that she has to keep fighting for every little thing just to see her son.  She does not use the clonazepam on a regular basis only uses it as needed.  She has been taking her Viibryd and temazepam regularly.  Writer provided her with supportive therapy during the session.  Visit Diagnosis:    ICD-10-CM   1. MDD (major depressive disorder), recurrent episode, moderate (HCC)  F33.1   2. PTSD (post-traumatic stress disorder)  F43.10     Past Psychiatric History: Depression, PTSD  Past Medical History:  Past Medical History:  Diagnosis Date  . Anemia   . Anxiety   . Asthma   . Bipolar disorder (HCC)   . Cough    chronic smoker   . Depression   . Headache   . PTSD (post-traumatic stress disorder)     Past Surgical History:  Procedure Laterality Date  . ABDOMINAL HYSTERECTOMY    . BREAST REDUCTION SURGERY  2004  . BREAST SURGERY    . CESAREAN SECTION    . CESAREAN SECTION    . GALLBLADDER SURGERY  [id number]  . HERNIA REPAIR    . LAPAROSCOPIC BILATERAL SALPINGECTOMY Right 08/28/2018   Procedure: LAPAROSCOPIC SALPINGECTOMY;  Surgeon: Ward, Elenora Fender, MD;  Location: [facility];  Service: Gynecology;  Laterality: Right;  . LAPAROSCOPIC HYSTERECTOMY N/A 08/28/2018   Procedure: HYSTERECTOMY TOTAL LAPAROSCOPIC;  Surgeon: Ward, Elenora Fender, MD;  Location: [facility];  Service: Gynecology;  Laterality: N/A;  . TONSILLECTOMY    . TONSILLECTOMY AND ADENOIDECTOMY  [id number]  . TUBAL LIGATION      Family Psychiatric History: See below  Family History:  Family History  Problem Relation Age of Onset  . Hypertension Mother   . Anxiety disorder Mother   . Depression Mother   . Anxiety disorder Father   . Depression Father   . Diabetes Maternal Aunt   . Diabetes Maternal Grandfather     Social History:   Social History   Socioeconomic History  . Marital status: Single    Spouse name: Not on file  . Number of children: Not on file  . Years of education: Not on file  . Highest education level: Not on file  Occupational History  . Not on file  Tobacco Use  . Smoking status: Current Some Day Smoker    Packs/day: 0.50    Types: Cigarettes    Start date: 05/05/2003  . Smokeless tobacco: Never Used  Vaping Use  . Vaping Use: Never used  Substance and Sexual Activity  . Alcohol use: Yes    Alcohol/week: 0.0 standard drinks    Comment: rare  . Drug use: No  . Sexual activity: Yes    Birth control/protection: I.U.D.  Other Topics Concern  . Not on file  Social History Narrative  . Not on file   Social Determinants of Health   Financial Resource Strain:   . Difficulty of Paying Living Expenses: Not on file  Food Insecurity:   . Worried About Programme researcher, broadcasting/film/video in the Last Year: Not on file  . Ran Out of Food in the Last Year: Not on file  Transportation Needs:   . Lack of Transportation (Medical): Not on file  . Lack of Transportation (Non-Medical): Not on file  Physical Activity:   . Days of Exercise per Week: Not on file  . Minutes of Exercise per Session: Not on file  Stress:   . Feeling of Stress : Not on file  Social Connections:   . Frequency of Communication with Friends and Family: Not on file  . Frequency of Social Gatherings with Friends and Family: Not on file  . Attends Religious Services: Not on file  . Active Member of Clubs or Organizations: Not on file  . Attends Banker Meetings: Not on file  . Marital Status: Not on file    Allergies:  Allergies  Allergen Reactions  . Sumatriptan Rash  . Hydrocodone-Acetaminophen Nausea Only  . Sumatriptan Succinate Rash    Metabolic Disorder Labs: No results found for: HGBA1C, MPG No results found for: PROLACTIN No results found for: CHOL, TRIG, HDL, CHOLHDL, VLDL, LDLCALC Lab Results   Component Value Date   TSH 2.290 07/29/2017    Therapeutic Level Labs: No results found for: LITHIUM No results found for: VALPROATE No components found for:  CBMZ  Current Medications: Current Outpatient Medications  Medication Sig Dispense Refill  . albuterol (PROAIR HFA) 108 (90 BASE) MCG/ACT inhaler Inhale 2 puffs into the lungs every 6 (six) hours as needed for wheezing or shortness of breath.     . clonazePAM (KLONOPIN) 0.5 MG tablet Take 1 tablet (0.5 mg total) by mouth 2 (two) times daily. 60 tablet 0  . cyclobenzaprine (FLEXERIL) 10 MG tablet Take 10 mg by mouth  2 (two) times daily as needed for muscle spasms.    . fluticasone (FLOVENT HFA) 110 MCG/ACT inhaler Inhale 2 puffs into the lungs daily as needed (for shortness of breath).     Marland Kitchen ibuprofen (ADVIL,MOTRIN) 800 MG tablet Take 1 tablet (800 mg total) by mouth every 8 (eight) hours as needed. 45 tablet 1  . temazepam (RESTORIL) 15 MG capsule Take 1 capsule (15 mg total) by mouth at bedtime. 30 capsule 1  . Vilazodone HCl (VIIBRYD) 40 MG TABS Take 1 tablet (40 mg total) by mouth daily. 30 tablet 1   No current facility-administered medications for this visit.    Musculoskeletal: Strength & Muscle Tone: unable to assess due to telemed visit Gait & Station: unable to assess due to telemed visit Patient leans: unable to assess due to telemed visit   Psychiatric Specialty Exam: Review of Systems  There were no vitals taken for this visit.There is no height or weight on file to calculate BMI.  General Appearance: Fairly Groomed  Eye Contact:  Good  Speech:  Clear and Coherent and Normal Rate  Volume:  Normal  Mood: Less Depressed  Affect: Less  depressed  Thought Process:  Goal Directed, Linear and Descriptions of Associations: Intact  Orientation:  Full (Time, Place, and Person)  Thought Content: Logical   Suicidal Thoughts:  No  Homicidal Thoughts:  No  Memory:  Recent;   Good Remote;   Good  Judgement:  Fair   Insight:  Fair  Psychomotor Activity:  Normal  Concentration:  Concentration: Good and Attention Span: Good  Recall:  Good  Fund of Knowledge: Good  Language: Good  Akathisia:  Negative  Handed:  Right  AIMS (if indicated): not done  Assets:  Communication Skills Desire for Improvement Financial Resources/Insurance Housing Social Support  ADL's:  Intact  Cognition: WNL  Sleep:  Fair     Assessment and Plan: Patient continues to have conflictual relationship with her mother over the visitation times with her son.  It seems like every time her mother and stepfather throw another challenge making it hard for her to see her son. PDMP reviewed.  1. MDD (major depressive disorder), recurrent, in partial remission (HCC)  - temazepam (RESTORIL) 15 MG capsule; Take 1 capsule (15 mg total) by mouth at bedtime.  Dispense: 30 capsule; Refill: 1 ( Last filled on 11/18/2020)  - Vilazodone HCl (VIIBRYD) 40 MG TABS; Take 1 tablet (40 mg total) by mouth daily.  Dispense: 30 tablet; Refill: 1 - clonazePAM (KLONOPIN) 0.5 MG tablet; Take 1 tablet (0.5 mg total) by mouth 2 (two) times daily as needed for anxiety.  Dispense: 60 tablet; Refill: 0 ( Last filled on 11/18/2020)  2. PTSD (post-traumatic stress disorder)  - Vilazodone HCl (VIIBRYD) 40 MG TABS; Take 1 tablet (40 mg total) by mouth daily.  Dispense: 30 tablet; Refill: 1   Continue the same regimen. Follow-up in 6 weeks.    Zena Amos, MD 06/19/2020, 10:29 AM

## 2021-03-04 ENCOUNTER — Other Ambulatory Visit: Payer: Self-pay

## 2021-03-04 ENCOUNTER — Encounter (HOSPITAL_COMMUNITY): Payer: Self-pay | Admitting: Psychiatry

## 2021-03-04 ENCOUNTER — Telehealth (INDEPENDENT_AMBULATORY_CARE_PROVIDER_SITE_OTHER): Payer: Medicare HMO | Admitting: Psychiatry

## 2021-03-04 DIAGNOSIS — F4310 Post-traumatic stress disorder, unspecified: Secondary | ICD-10-CM

## 2021-03-04 DIAGNOSIS — F3341 Major depressive disorder, recurrent, in partial remission: Secondary | ICD-10-CM | POA: Diagnosis not present

## 2021-03-04 MED ORDER — CLONAZEPAM 0.5 MG PO TABS
0.5000 mg | ORAL_TABLET | Freq: Two times a day (BID) | ORAL | 1 refills | Status: DC | PRN
Start: 2021-03-04 — End: 2021-08-21

## 2021-03-04 MED ORDER — TEMAZEPAM 15 MG PO CAPS: 15.0000 mg | ORAL_CAPSULE | Freq: Every day | ORAL | 1 refills | Status: DC

## 2021-03-04 MED ORDER — VILAZODONE HCL 40 MG PO TABS: 40.0000 mg | ORAL_TABLET | Freq: Every day | ORAL | 1 refills | Status: DC
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
## 2021-04-07 ENCOUNTER — Other Ambulatory Visit: Payer: Self-pay

## 2021-04-07 ENCOUNTER — Ambulatory Visit (INDEPENDENT_AMBULATORY_CARE_PROVIDER_SITE_OTHER): Payer: Medicare HMO | Admitting: Licensed Clinical Social Worker

## 2021-04-07 DIAGNOSIS — F4310 Post-traumatic stress disorder, unspecified: Secondary | ICD-10-CM | POA: Diagnosis not present
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
## 2021-04-15 ENCOUNTER — Other Ambulatory Visit: Payer: Self-pay | Admitting: Psychiatry

## 2021-04-15 ENCOUNTER — Telehealth: Payer: Self-pay

## 2021-04-15 ENCOUNTER — Telehealth (INDEPENDENT_AMBULATORY_CARE_PROVIDER_SITE_OTHER): Payer: Medicare HMO | Admitting: Psychiatry

## 2021-04-15 ENCOUNTER — Encounter: Payer: Self-pay | Admitting: Psychiatry

## 2021-04-15 ENCOUNTER — Other Ambulatory Visit: Payer: Self-pay

## 2021-04-15 DIAGNOSIS — F4310 Post-traumatic stress disorder, unspecified: Secondary | ICD-10-CM

## 2021-04-15 DIAGNOSIS — F331 Major depressive disorder, recurrent, moderate: Secondary | ICD-10-CM

## 2021-04-15 DIAGNOSIS — G4700 Insomnia, unspecified: Secondary | ICD-10-CM

## 2021-04-15 MED ORDER — ARIPIPRAZOLE 2 MG PO TABS: 2.0000 mg | ORAL_TABLET | Freq: Every day | ORAL | 0 refills | Status: DC

## 2021-04-15 MED ORDER — PRAZOSIN HCL 1 MG PO CAPS: ORAL_CAPSULE | ORAL | 0 refills | Status: DC

## 2021-04-15 MED ORDER — VILAZODONE HCL 40 MG PO TABS: 40.0000 mg | ORAL_TABLET | Freq: Every day | ORAL | 1 refills | Status: DC

## 2021-04-15 MED ORDER — TRAZODONE HCL 50 MG PO TABS: ORAL_TABLET | ORAL | 0 refills | Status: DC

## 2021-04-15 NOTE — Telephone Encounter (Signed)
Sorry. Trazodone was sent to the pharmacy

## 2021-04-15 NOTE — Patient Instructions (Addendum)
Continue vilazodone 40 mg daily Start Abilify 2 mg at night  Continue clonazepam 0.5 mg twice a day as needed for anxiety Discontinue temazepam Start Trazodone 25-50 mg at night as needed for insomnia  Start prazosin 1 mg at night for 3 days, then 2 mg at night

## 2021-04-15 NOTE — Telephone Encounter (Signed)
pt called states that the new medication you was going to start her on was not sent to the pharmacy

## 2021-05-08 ENCOUNTER — Ambulatory Visit (INDEPENDENT_AMBULATORY_CARE_PROVIDER_SITE_OTHER): Payer: Medicare HMO | Admitting: Licensed Clinical Social Worker

## 2021-05-08 ENCOUNTER — Other Ambulatory Visit: Payer: Self-pay

## 2021-05-08 DIAGNOSIS — F4310 Post-traumatic stress disorder, unspecified: Secondary | ICD-10-CM

## 2021-05-08 NOTE — Progress Notes (Signed)
Virtual Visit via Video Note  I connected with Bailey [name] on 05/08/21 at 11:00 AM EDT by a video enabled telemedicine application and verified that I am speaking with the correct person using two identifiers.  Location: Patient: home Provider: remote office [place], [place])   I discussed the limitations of evaluation and management by telemedicine and the availability of in person appointments. The patient expressed understanding and agreed to proceed.   I discussed the assessment and treatment plan with the patient. The patient was provided an opportunity to ask questions and all were answered. The patient agreed with the plan and demonstrated an understanding of the instructions.   The patient was advised to call back or seek an in-person evaluation if the symptoms worsen or if the condition fails to improve as anticipated.  I provided 60 minutes of non-face-to-face time during this encounter.   Urian Martenson R Kierstyn Baranowski, LCSW  THERAPIST PROGRESS NOTE  Session Time: 11a-12p  Participation Level: Active  Behavioral Response: Neat and Well GroomedAlertAnxious and Depressed  Type of Therapy: Individual Therapy  Treatment Goals addressed: Anxiety and Diagnosis: PTSD, depression  Interventions: Supportive and Other: trauma focused  Summary: Bailey [name] is a 35 y.o. female who presents with continuing symptoms related to PTSD diagnosis. Patient reports overall mood has been fluctuating, and anxiety symptoms have increased since last session. Allowed patient safe space to explore and express thoughts and feelings associated with recent external stressors and recent life events. Patient feels that recent escalations in symptoms triggered by court case involving patients mother and custody of patient son. The court case did not go the way patient was hoping that it would go, and patient son is still in custody with patient mother. Allowed patient to explore thoughts and feelings  associated with the decision, and for patient to explore complicated relationship with her mother. Reviewed importance of self-care activities in positive reframing. Patient feels like she has good support at home, and currently has daughter in her custody.Continued recommendations are as follows: self care behaviors, positive social engagements, focusing on overall work/home/life balance, and focusing on positive physical and emotional wellness.  .   Suicidal/Homicidal: No  Therapist Response: Patient is able to verbally identify the source of depressed mood, and is able to experience sadness in session while discussing disappointments related to recent losses. Patient is able to recall traumatic events without becoming overwhelmed with negative emotions. Patient is able to display a full range of emotions without experiencing loss of control. Patient is able to identify and continue to develop effective coping mechanisms to allow for carrying out normal responsibilities and participating in healthy relationships and socially engaging activities. These behaviors are reflective of continuing personal growth and progress. Treatment to continue as indicated.  Plan: Return again in 4 weeks.  Diagnosis: Axis I: Post Traumatic Stress Disorder    Axis II: No diagnosis    Ernest [name] Aydien Majette, LCSW 05/08/2021
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
## 2021-05-15 ENCOUNTER — Ambulatory Visit: Payer: Medicare HMO | Admitting: Licensed Clinical Social Worker

## 2021-05-15 ENCOUNTER — Encounter: Payer: Self-pay | Admitting: Psychiatry

## 2021-05-15 ENCOUNTER — Other Ambulatory Visit: Payer: Self-pay

## 2021-05-15 ENCOUNTER — Telehealth (INDEPENDENT_AMBULATORY_CARE_PROVIDER_SITE_OTHER): Payer: Medicare HMO | Admitting: Psychiatry

## 2021-05-15 DIAGNOSIS — G4700 Insomnia, unspecified: Secondary | ICD-10-CM

## 2021-05-15 DIAGNOSIS — F4310 Post-traumatic stress disorder, unspecified: Secondary | ICD-10-CM | POA: Diagnosis not present

## 2021-05-15 DIAGNOSIS — F331 Major depressive disorder, recurrent, moderate: Secondary | ICD-10-CM

## 2021-05-15 MED ORDER — PRAZOSIN HCL 2 MG PO CAPS: 2.0000 mg | ORAL_CAPSULE | Freq: Every day | ORAL | 1 refills | Status: DC

## 2021-05-15 MED ORDER — TRAZODONE HCL 50 MG PO TABS: ORAL_TABLET | ORAL | 1 refills | Status: DC

## 2021-05-15 MED ORDER — VILAZODONE HCL 40 MG PO TABS: 40.0000 mg | ORAL_TABLET | Freq: Every day | ORAL | 0 refills | Status: DC

## 2021-05-15 NOTE — Patient Instructions (Signed)
Continue vilazodone 40 mg daily Hold Abilify Continue clonazepam 0.5 mg twice a day as needed for anxiety 4. Start Trazodone 25-50 mg at night as needed for insomnia  Start prazosin 1 mg at night for 3 days, then 2 mg at night

## 2021-06-05 ENCOUNTER — Other Ambulatory Visit: Payer: Self-pay

## 2021-06-05 ENCOUNTER — Ambulatory Visit (INDEPENDENT_AMBULATORY_CARE_PROVIDER_SITE_OTHER): Payer: Self-pay | Admitting: Licensed Clinical Social Worker

## 2021-06-05 DIAGNOSIS — Z5329 Procedure and treatment not carried out because of patient's decision for other reasons: Secondary | ICD-10-CM

## 2021-06-05 NOTE — Progress Notes (Signed)
LCSW counselor tried to connect with patient for scheduled appointment via MyChart video text request x 2 and email request; also tried to connect via phone without success. LCSW counselor left message for patient to call office number to reschedule OPT appointment.  

## 2021-06-11 ENCOUNTER — Other Ambulatory Visit: Payer: Self-pay

## 2021-06-11 ENCOUNTER — Ambulatory Visit (INDEPENDENT_AMBULATORY_CARE_PROVIDER_SITE_OTHER): Payer: Medicare HMO | Admitting: Licensed Clinical Social Worker

## 2021-06-11 DIAGNOSIS — F4310 Post-traumatic stress disorder, unspecified: Secondary | ICD-10-CM | POA: Diagnosis not present

## 2021-06-11 NOTE — Progress Notes (Signed)
Virtual Visit via Video Note  I connected with Bailey [name] on 06/11/21 at 11:00 AM EDT by a video enabled telemedicine application and verified that I am speaking with the correct person using two identifiers.  Location: Patient: home Provider: remote office [place], [place])   I discussed the limitations of evaluation and management by telemedicine and the availability of in person appointments. The patient expressed understanding and agreed to proceed.   I discussed the assessment and treatment plan with the patient. The patient was provided an opportunity to ask questions and all were answered. The patient agreed with the plan and demonstrated an understanding of the instructions.   The patient was advised to call back or seek an in-person evaluation if the symptoms worsen or if the condition fails to improve as anticipated.  I provided 60 minutes of non-face-to-face time during this encounter.   Ernest [name] Kimarie Coor, LCSW'   THERAPIST PROGRESS NOTE  Session Time: 11a-12p  Participation Level: Active  Behavioral Response: Neat and Well GroomedAlertAnxious  Type of Therapy: Individual Therapy  Treatment Goals addressed: Anxiety  Interventions: CBT and Other: trauma focused  Summary: Bailey [name] is a 35 y.o. female who presents with improving symptoms related to PTSD diagnosis. Pt reports at beginning of session "I am doing SO much better".  Pt reports good quality and quantity of sleep.  Allowed pt safe space to explore and express thoughts and feelings associated with recent life events. Pt reports that she is still having conflict with her stepfather and mother over visitation with her son. Pt reports that she is getting visitation with her son and is very happy about this. Reviewed healthy coping strategies for depression symptoms, anxiety symptoms, and panic attacks. Continued recommendations are as follows: self care behaviors, positive social engagements, focusing on  overall work/home/life balance, and focusing on positive physical and emotional wellness.  .   Suicidal/Homicidal: No  Therapist Response: Patient is able to verbally identify the source of depressed mood, and is able to experience sadness in session while discussing disappointments related to recent losses. Patient is able to recall traumatic events without becoming overwhelmed with negative emotions. Patient is able to display a full range of emotions without experiencing loss of control. Patient is able to identify and continue to develop effective coping mechanisms to allow for carrying out normal responsibilities and participating in healthy relationships and socially engaging activities. These behaviors are reflective of continuing personal growth and progress. Treatment to continue as indicated.  Plan: Return again in 4 weeks.  Diagnosis: Axis I: Post Traumatic Stress Disorder    Axis II: No diagnosis    Ernest [name] Horacio Werth, LCSW 06/11/2021

## 2021-06-23 ENCOUNTER — Ambulatory Visit: Payer: Self-pay | Admitting: Surgery
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
## 2021-07-06 ENCOUNTER — Other Ambulatory Visit
Admission: RE | Admit: 2021-07-06 | Discharge: 2021-07-06 | Disposition: A | Discharge status: Home / Self Care | Payer: Medicare HMO | Source: Ambulatory Visit | Attending: Surgery | Admitting: Surgery

## 2021-07-06 ENCOUNTER — Other Ambulatory Visit: Payer: Self-pay

## 2021-07-06 HISTORY — DX: Personal history of urinary calculi: Z87.442

## 2021-07-06 NOTE — Patient Instructions (Signed)
Your procedure is scheduled on: Thursday July 16, 2021. Report to [facility] inside [facility] 2nd floor stop by admissions desk before getting on elevator.  To find out your arrival time please call [phone] between 1PM - 3PM on Wednesday July 15, 2021.  Remember: Instructions that are not followed completely may result in serious medical risk,  up to and including death, or upon the discretion of your surgeon and anesthesiologist your  surgery may need to be rescheduled.     _X__ 1. Do not eat food after midnight the night before your procedure.                 No chewing gum or hard candies. You may drink clear liquids up to 2 hours                 before you are scheduled to arrive for your surgery- DO not drink clear                 liquids within 2 hours of the start of your surgery.                 Clear Liquids include:  water, apple juice without pulp, clear Gatorade, G2 or                  Gatorade Zero (avoid Red/Purple/Blue), Black Coffee or Tea (Do not add                 anything to coffee or tea).  __X__2.  On the morning of surgery brush your teeth with toothpaste and water, you                may rinse your mouth with mouthwash if you wish.  Do not swallow any toothpaste of mouthwash.     _X__ 3.  No Alcohol for 24 hours before or after surgery.   _X__ 4.  Do Not Smoke or use e-cigarettes For 24 Hours Prior to Your Surgery.                 Do not use any chewable tobacco products for at least 6 hours prior to                 Surgery.  _X__  5.  Do not use any recreational drugs (marijuana, cocaine, heroin, ecstasy, MDMA or other)                For at least one week prior to your surgery.  Combination of these drugs with anesthesia                May have life threatening results.  __X__ 6.  Notify your doctor if there is any change in your medical condition      (cold, fever, infections).     Do not wear jewelry, make-up,  hairpins, clips or nail polish. Do not wear lotions, powders, or perfumes. You may wear deodorant. Do not shave 48 hours prior to surgery.  Do not bring valuables to the hospital.    [facility] is not responsible for any belongings or valuables.  Contacts, dentures or bridgework may not be worn into surgery. Leave your suitcase in the car. After surgery it may be brought to your room. For patients admitted to the hospital, discharge time is determined by your treatment team.   Patients discharged the day of surgery will not be allowed to drive home.   Make arrangements  for someone to be with you for the first 24 hours of your Same Day Discharge.  __X__ Take these medicines the morning of surgery with A SIP OF WATER:    1. None   2.   3.   4.  5.  6.  ____ Fleet Enema (as directed)   __X__ Use antibacterial soap as directed  ____ Use Benzoyl Peroxide Gel as instructed  __X__ Use inhalers on the day of surgery  albuterol (VENTOLIN HFA) 108 (90 Base) MCG/ACT inhaler  ____ Stop metformin 2 days prior to surgery    ____ Take 1/2 of usual insulin dose the night before surgery. No insulin the morning          of surgery.   ____ Call your PCP, cardiologist, or Pulmonologist if taking Coumadin/Plavix/aspirin and ask when to stop before your surgery.   __X__ One Week prior to surgery- Stop Anti-inflammatories such as Ibuprofen, Aleve, Advil, Motrin, meloxicam (MOBIC), diclofenac, etodolac, ketorolac, Toradol, Daypro, piroxicam, Goody's or BC powders. OK TO USE TYLENOL IF NEEDED   __X__ Do not start any supplements or supplements before your procedure.    ____ Bring C-Pap to the hospital.    If you have any questions regarding your pre-procedure instructions,  Please call Pre-admit Testing at [phone].

## 2021-07-07 ENCOUNTER — Ambulatory Visit
Admission: RE | Admit: 2021-07-07 | Discharge: 2021-07-07 | Disposition: A | Discharge status: Home / Self Care | Payer: Medicare HMO | Attending: Family Medicine | Admitting: Family Medicine

## 2021-07-07 ENCOUNTER — Ambulatory Visit
Admission: RE | Admit: 2021-07-07 | Discharge: 2021-07-07 | Disposition: A | Discharge status: Home / Self Care | Payer: Medicare HMO | Source: Ambulatory Visit | Attending: Family Medicine | Admitting: Family Medicine

## 2021-07-07 ENCOUNTER — Other Ambulatory Visit: Payer: Self-pay | Admitting: Family Medicine

## 2021-07-07 DIAGNOSIS — J441 Chronic obstructive pulmonary disease with (acute) exacerbation: Secondary | ICD-10-CM

## 2021-07-07 DIAGNOSIS — R0602 Shortness of breath: Secondary | ICD-10-CM | POA: Insufficient documentation

## 2021-07-07 DIAGNOSIS — R052 Subacute cough: Secondary | ICD-10-CM | POA: Insufficient documentation

## 2021-07-16 ENCOUNTER — Ambulatory Visit: Payer: Medicare HMO | Admitting: Licensed Clinical Social Worker
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
## 2021-07-24 ENCOUNTER — Telehealth: Payer: Self-pay | Admitting: Psychiatry

## 2021-07-24 ENCOUNTER — Telehealth: Payer: Medicare HMO | Admitting: Psychiatry

## 2021-07-24 ENCOUNTER — Other Ambulatory Visit: Payer: Self-pay

## 2021-07-24 NOTE — Telephone Encounter (Signed)
Sent link for video visit through Epic. Patient did not sign in. Called the patient for appointment scheduled today. The patient did not answer the phone. Left voice message to contact the office ([phone]).   ?

## 2021-08-03 ENCOUNTER — Ambulatory Visit (INDEPENDENT_AMBULATORY_CARE_PROVIDER_SITE_OTHER): Payer: Medicare HMO | Admitting: Licensed Clinical Social Worker

## 2021-08-03 ENCOUNTER — Other Ambulatory Visit: Payer: Self-pay

## 2021-08-03 DIAGNOSIS — F4310 Post-traumatic stress disorder, unspecified: Secondary | ICD-10-CM | POA: Diagnosis not present

## 2021-08-03 NOTE — Progress Notes (Signed)
Virtual Visit via Video Note  I connected with Bailey [name] on 08/03/21 at  8:00 AM EDT by a video enabled telemedicine application and verified that I am speaking with the correct person using two identifiers.  Location: Patient: home Provider: remote office [place], [place])   I discussed the limitations of evaluation and management by telemedicine and the availability of in person appointments. The patient expressed understanding and agreed to proceed.   I discussed the assessment and treatment plan with the patient. The patient was provided an opportunity to ask questions and all were answered. The patient agreed with the plan and demonstrated an understanding of the instructions.   The patient was advised to call back or seek an in-person evaluation if the symptoms worsen or if the condition fails to improve as anticipated.  I provided 60 minutes of non-face-to-face time during this encounter.   Toua Stites R Kristina Bertone, LCSW  THERAPIST PROGRESS NOTE  Session Time: 8-9a  Participation Level: Active  Behavioral Response: Neat and Well GroomedAlertAnxious and Depressed  Type of Therapy: Individual Therapy  Treatment Goals addressed:  Goal: LTG: Reduce frequency, intensity, and duration of PTSD symptoms so daily functioning is improved: Input needed on appropriate metric.  Per pt self report Outcome: Progressing  Goal: STG: Dejanee WILL PRACTICE EMOTION REGULATION SKILLS 7 PER WEEK FOR THE NEXT 16 WEEKS Outcome: Progressing  Interventions:  Intervention: WORK WITH Ulani TO COMPLETE A TRAUMA INTERVIEW IN AN INDIVIDUAL SESSION  Intervention: REVIEW WITH Alexxa THEIR PROGRESS AND DEVELOP A PLAN FOR CONTINUED PRACTICE OUTSIDE OF INDIVIDUAL THERAPY SESSIONS  Intervention: PROVIDE Sofya EDUCATION ON COMMUNICATION PATTERNS IN RELATIONSHIPS  Intervention: Support useful positive self-talk  Intervention: Discuss self-management skills  Summary: Bailey [name] is a 35 y.o.  female who presents with stress associated with PTSD diagnosis. Pt reports that she is having some depression, some anxiety, and occasional insomnia. Pt reports mood stable and that she is using coping skills to manage anxiety/stress symptoms.   Allowed pt to explore and express thoughts and feelings associated with recent life situations and external stressors. Discussed pts relationship with mother, traumas from the past (and overall psychological impact in thoughts/behaviors in present), pts recent breakup with partner (over her being his caregiver), relationship with son, Verdon Cummins and daughter. Reviewed importance of setting limits and boundaries with self, situations, and others.   Continued recommendations are as follows: self care behaviors, positive social engagements, focusing on overall work/home/life balance, and focusing on positive physical and emotional wellness.   Suicidal/Homicidal: No  Therapist Response: Pt is continuing to apply interventions learned in session into daily life situations. Pt is currently on track to meet goals utilizing interventions mentioned above. Personal growth and progress noted. Treatment to continue as indicated.   Plan: Return again in 3 weeks.  Diagnosis: Axis I: Post Traumatic Stress Disorder    Axis II: No diagnosis    Ernest [name] Sadonna Kotara, LCSW 08/03/2021

## 2021-08-03 NOTE — Plan of Care (Signed)
  Problem: Reduce the negative impact trauma related symptoms have on social, occupational, and family functioning. Goal: LTG: Reduce frequency, intensity, and duration of PTSD symptoms so daily functioning is improved: Input needed on appropriate metric.  Per pt self report Outcome: Progressing Goal: STG: Bailey [name] WILL PRACTICE EMOTION REGULATION SKILLS 7 PER WEEK FOR THE NEXT 16 WEEKS Outcome: Progressing Intervention: WORK WITH Bailey [name] TO COMPLETE A TRAUMA INTERVIEW IN AN INDIVIDUAL SESSION Intervention: REVIEW WITH Bailey [name] THEIR PROGRESS AND DEVELOP A PLAN FOR CONTINUED PRACTICE OUTSIDE OF INDIVIDUAL THERAPY SESSIONS Intervention: PROVIDE Bailey [name] EDUCATION ON COMMUNICATION PATTERNS IN RELATIONSHIPS Intervention: Support useful positive self-talk Intervention: Discuss self-management skills

## 2021-08-21 ENCOUNTER — Encounter: Payer: Self-pay | Admitting: Surgery

## 2021-08-21 ENCOUNTER — Encounter
Admission: RE | Disposition: A | Discharge status: Home / Self Care | Payer: Self-pay | Source: Home / Self Care | Attending: Surgery

## 2021-08-21 ENCOUNTER — Ambulatory Visit
Admission: RE | Admit: 2021-08-21 | Discharge: 2021-08-21 | Disposition: A | Discharge status: Home / Self Care | Payer: Medicare HMO | Attending: Surgery | Admitting: Surgery

## 2021-08-21 ENCOUNTER — Ambulatory Visit: Payer: Medicare HMO | Admitting: Certified Registered"

## 2021-08-21 DIAGNOSIS — F17210 Nicotine dependence, cigarettes, uncomplicated: Secondary | ICD-10-CM | POA: Diagnosis not present

## 2021-08-21 DIAGNOSIS — Z79899 Other long term (current) drug therapy: Secondary | ICD-10-CM | POA: Insufficient documentation

## 2021-08-21 DIAGNOSIS — Z888 Allergy status to other drugs, medicaments and biological substances status: Secondary | ICD-10-CM | POA: Insufficient documentation

## 2021-08-21 DIAGNOSIS — L7211 Pilar cyst: Secondary | ICD-10-CM | POA: Diagnosis present

## 2021-08-21 DIAGNOSIS — L7212 Trichodermal cyst: Secondary | ICD-10-CM | POA: Insufficient documentation

## 2021-08-21 DIAGNOSIS — Z885 Allergy status to narcotic agent status: Secondary | ICD-10-CM | POA: Diagnosis not present

## 2021-08-21 HISTORY — PX: CYST EXCISION: SHX5701

## 2021-08-21 SURGERY — CYST REMOVAL
Anesthesia: General | Site: Scalp

## 2021-08-21 MED ORDER — DOCUSATE SODIUM 100 MG PO CAPS: 100.0000 mg | ORAL_CAPSULE | Freq: Two times a day (BID) | ORAL | 0 refills | Status: AC | PRN

## 2021-08-21 MED ORDER — LACTATED RINGERS IV SOLN: INTRAVENOUS | Status: DC

## 2021-08-21 MED ORDER — 0.9 % SODIUM CHLORIDE (POUR BTL) OPTIME
TOPICAL | Status: DC | PRN
  Administered 2021-08-21: 500 mL

## 2021-08-21 MED ORDER — OXYCODONE HCL 5 MG PO TABS: 5.0000 mg | ORAL_TABLET | Freq: Once | ORAL | Status: DC | PRN

## 2021-08-21 MED ORDER — ONDANSETRON HCL 4 MG/2ML IJ SOLN: 4.0000 mg | Freq: Once | INTRAMUSCULAR | Status: DC

## 2021-08-21 MED ORDER — IBUPROFEN 800 MG PO TABS: 800.0000 mg | ORAL_TABLET | Freq: Three times a day (TID) | ORAL | 0 refills | Status: DC | PRN

## 2021-08-21 MED ORDER — CHLORHEXIDINE GLUCONATE 0.12 % MT SOLN
15.0000 mL | Freq: Once | OROMUCOSAL | Status: AC
Start: 2021-08-21 — End: 2021-08-21
  Administered 2021-08-21: 15 mL via OROMUCOSAL

## 2021-08-21 MED ORDER — FAMOTIDINE 20 MG PO TABS
ORAL_TABLET | ORAL | Status: AC
  Filled 2021-08-21: qty 1

## 2021-08-21 MED ORDER — PROPOFOL 10 MG/ML IV BOLUS
INTRAVENOUS | Status: AC
  Filled 2021-08-21: qty 40

## 2021-08-21 MED ORDER — MIDAZOLAM HCL 2 MG/2ML IJ SOLN
INTRAMUSCULAR | Status: DC | PRN
Start: 2021-08-21 — End: 2021-08-21
  Administered 2021-08-21: 2 mg via INTRAVENOUS

## 2021-08-21 MED ORDER — LIDOCAINE HCL (CARDIAC) PF 100 MG/5ML IV SOSY
PREFILLED_SYRINGE | INTRAVENOUS | Status: DC | PRN
  Administered 2021-08-21: 100 mg via INTRAVENOUS

## 2021-08-21 MED ORDER — BACITRACIN ZINC 500 UNIT/GM EX OINT
TOPICAL_OINTMENT | CUTANEOUS | Status: AC
  Filled 2021-08-21: qty 28.35

## 2021-08-21 MED ORDER — BUPIVACAINE-EPINEPHRINE (PF) 0.5% -1:200000 IJ SOLN
INTRAMUSCULAR | Status: AC
  Filled 2021-08-21: qty 30

## 2021-08-21 MED ORDER — MIDAZOLAM HCL 2 MG/2ML IJ SOLN
INTRAMUSCULAR | Status: AC
  Filled 2021-08-21: qty 2

## 2021-08-21 MED ORDER — ACETAMINOPHEN 10 MG/ML IV SOLN: 1000.0000 mg | Freq: Once | INTRAVENOUS | Status: DC | PRN

## 2021-08-21 MED ORDER — FENTANYL CITRATE (PF) 100 MCG/2ML IJ SOLN
INTRAMUSCULAR | Status: DC | PRN
  Administered 2021-08-21 (×4): 25 ug via INTRAVENOUS

## 2021-08-21 MED ORDER — ONDANSETRON HCL 4 MG/2ML IJ SOLN
4.0000 mg | Freq: Once | INTRAMUSCULAR | Status: AC | PRN
  Administered 2021-08-21: 4 mg via INTRAVENOUS

## 2021-08-21 MED ORDER — CEFAZOLIN SODIUM-DEXTROSE 2-4 GM/100ML-% IV SOLN
2.0000 g | INTRAVENOUS | Status: AC
  Administered 2021-08-21: 2 g via INTRAVENOUS

## 2021-08-21 MED ORDER — CEFAZOLIN SODIUM-DEXTROSE 2-4 GM/100ML-% IV SOLN
INTRAVENOUS | Status: AC
  Filled 2021-08-21: qty 100

## 2021-08-21 MED ORDER — OXYCODONE HCL 5 MG/5ML PO SOLN: 5.0000 mg | Freq: Once | ORAL | Status: DC | PRN

## 2021-08-21 MED ORDER — LIDOCAINE HCL (PF) 1 % IJ SOLN
INTRAMUSCULAR | Status: DC | PRN
  Administered 2021-08-21: 8 mL

## 2021-08-21 MED ORDER — PHENYLEPHRINE HCL (PRESSORS) 10 MG/ML IV SOLN
INTRAVENOUS | Status: AC
  Filled 2021-08-21: qty 1

## 2021-08-21 MED ORDER — TRAMADOL HCL 50 MG PO TABS: 50.0000 mg | ORAL_TABLET | Freq: Four times a day (QID) | ORAL | 0 refills | Status: DC | PRN

## 2021-08-21 MED ORDER — CHLORHEXIDINE GLUCONATE CLOTH 2 % EX PADS: 6.0000 | MEDICATED_PAD | Freq: Once | CUTANEOUS | Status: DC

## 2021-08-21 MED ORDER — CHLORHEXIDINE GLUCONATE 0.12 % MT SOLN
OROMUCOSAL | Status: AC
  Filled 2021-08-21: qty 15

## 2021-08-21 MED ORDER — PROPOFOL 10 MG/ML IV BOLUS
INTRAVENOUS | Status: DC | PRN
  Administered 2021-08-21: 170 mg via INTRAVENOUS
  Administered 2021-08-21: 30 mg via INTRAVENOUS

## 2021-08-21 MED ORDER — ACETAMINOPHEN 325 MG PO TABS: 650.0000 mg | ORAL_TABLET | Freq: Three times a day (TID) | ORAL | 0 refills | Status: AC | PRN

## 2021-08-21 MED ORDER — ONDANSETRON HCL 4 MG/2ML IJ SOLN
INTRAMUSCULAR | Status: DC | PRN
  Administered 2021-08-21: 4 mg via INTRAVENOUS

## 2021-08-21 MED ORDER — ORAL CARE MOUTH RINSE
15.0000 mL | Freq: Once | OROMUCOSAL | Status: AC
Start: 2021-08-21 — End: 2021-08-21

## 2021-08-21 MED ORDER — DEXAMETHASONE SODIUM PHOSPHATE 10 MG/ML IJ SOLN
INTRAMUSCULAR | Status: DC | PRN
  Administered 2021-08-21: 5 mg via INTRAVENOUS

## 2021-08-21 MED ORDER — FENTANYL CITRATE (PF) 100 MCG/2ML IJ SOLN: 25.0000 ug | INTRAMUSCULAR | Status: DC | PRN

## 2021-08-21 MED ORDER — BACITRACIN ZINC 500 UNIT/GM EX OINT
TOPICAL_OINTMENT | CUTANEOUS | Status: DC | PRN
  Administered 2021-08-21: 1 via TOPICAL

## 2021-08-21 MED ORDER — FAMOTIDINE 20 MG PO TABS
20.0000 mg | ORAL_TABLET | Freq: Once | ORAL | Status: AC
  Administered 2021-08-21: 20 mg via ORAL

## 2021-08-21 MED ORDER — FENTANYL CITRATE (PF) 100 MCG/2ML IJ SOLN
INTRAMUSCULAR | Status: AC
  Filled 2021-08-21: qty 2

## 2021-08-21 MED ORDER — ONDANSETRON HCL 4 MG/2ML IJ SOLN
INTRAMUSCULAR | Status: AC
  Filled 2021-08-21: qty 2

## 2021-08-21 MED ORDER — LIDOCAINE HCL (PF) 1 % IJ SOLN
INTRAMUSCULAR | Status: AC
  Filled 2021-08-21: qty 30

## 2021-08-21 SURGICAL SUPPLY — 32 items
ADH SKN CLS APL DERMABOND .7 (GAUZE/BANDAGES/DRESSINGS) ×1
BLADE SURG 15 STRL LF DISP TIS (BLADE) ×1 IMPLANT
BLADE SURG 15 STRL SS (BLADE) ×2
DERMABOND ADVANCED (GAUZE/BANDAGES/DRESSINGS) ×1
DERMABOND ADVANCED .7 DNX12 (GAUZE/BANDAGES/DRESSINGS) ×1 IMPLANT
DRAPE LAPAROTOMY 77X122 PED (DRAPES) ×2 IMPLANT
ELECT CAUTERY BLADE 6.4 (BLADE) ×2 IMPLANT
ELECT REM PT RETURN 9FT ADLT (ELECTROSURGICAL) ×2
ELECTRODE REM PT RTRN 9FT ADLT (ELECTROSURGICAL) ×1 IMPLANT
GAUZE 4X4 16PLY ~~LOC~~+RFID DBL (SPONGE) ×2 IMPLANT
GLOVE SURG SYN 6.5 ES PF (GLOVE) ×2 IMPLANT
GLOVE SURG UNDER POLY LF SZ7 (GLOVE) ×2 IMPLANT
GOWN STRL REUS W/ TWL LRG LVL3 (GOWN DISPOSABLE) ×2 IMPLANT
GOWN STRL REUS W/TWL LRG LVL3 (GOWN DISPOSABLE) ×4
KIT TURNOVER KIT A (KITS) ×2 IMPLANT
LABEL OR SOLS (LABEL) ×2 IMPLANT
MANIFOLD NEPTUNE II (INSTRUMENTS) ×2 IMPLANT
NEEDLE HYPO 22GX1.5 SAFETY (NEEDLE) ×2 IMPLANT
NS IRRIG 500ML POUR BTL (IV SOLUTION) ×2 IMPLANT
PACK BASIN MINOR ARMC (MISCELLANEOUS) ×2 IMPLANT
SOL PREP PVP 2OZ (MISCELLANEOUS) ×2
SOLUTION PREP PVP 2OZ (MISCELLANEOUS) ×1 IMPLANT
SUT ETHILON 3-0 (SUTURE) ×4 IMPLANT
SUT MNCRL 4-0 (SUTURE) ×2
SUT MNCRL 4-0 27XMFL (SUTURE) ×1
SUT VIC AB 3-0 SH 27 (SUTURE) ×2
SUT VIC AB 3-0 SH 27X BRD (SUTURE) ×1 IMPLANT
SUTURE MNCRL 4-0 27XMF (SUTURE) ×1 IMPLANT
SYR 30ML LL (SYRINGE) ×2 IMPLANT
SYR BULB IRRIG 60ML STRL (SYRINGE) ×2 IMPLANT
TOWEL OR 17X26 4PK STRL BLUE (TOWEL DISPOSABLE) ×2 IMPLANT
WATER STERILE IRR 500ML POUR (IV SOLUTION) ×2 IMPLANT

## 2021-08-21 NOTE — Discharge Instructions (Addendum)
Removal, Care After This sheet gives you information about how to care for yourself after your procedure. Your health care provider may also give you more specific instructions. If you have problems or questions, contact your health care provider. What can I expect after the procedure? After the procedure, it is common to have: Soreness. Bruising. Itching. Follow these instructions at home: site care Follow instructions from your health care provider about how to take care of your site. Make sure you: Wash your hands with soap and water, gently wash away old neosporing and apply neosporin to area daily If the area bleeds or bruises, apply gentle pressure for 10 minutes. OK TO SHOWER IN 48HRS  Check your site every day for signs of infection. Check for: Redness, swelling, or pain. Fluid or blood. Warmth. Pus or a bad smell.  General instructions Rest and then return to your normal activities as told by your health care provider.  tylenol and advil as needed for discomfort.  Please alternate between the two every four hours as needed for pain.    Use narcotics, if prescribed, only when tylenol and motrin is not enough to control pain.  325-650mg  every 8hrs to max of 3000mg /24hrs (including the 325mg  in every norco dose) for the tylenol.    Advil up to 800mg  per dose every 8hrs as needed for pain.   Keep all follow-up visits as told by your health care provider. This is important. Contact a health care provider if: You have redness, swelling, or pain around your site. You have fluid or blood coming from your site. Your site feels warm to the touch. You have pus or a bad smell coming from your site. You have a fever. Your sutures, skin glue, or adhesive strips loosen or come off sooner than expected. Get help right away if: You have bleeding that does not stop with pressure or a dressing. Summary After the procedure, it is common to have some soreness, bruising, and itching at the  site. Follow instructions from your health care provider about how to take care of your site. Check your site every day for signs of infection. Contact a health care provider if you have redness, swelling, or pain around your site, or your site feels warm to the touch. Keep all follow-up visits as told by your health care provider. This is important. This information is not intended to replace advice given to you by your health care provider. Make sure you discuss any questions you have with your health care provider. Document Released: 10/31/2015 Document Revised: 04/03/2018 Document Reviewed: 04/03/2018 Elsevier Interactive Patient Education  2019 [employer]   AMBULATORY SURGERY  DISCHARGE INSTRUCTIONS   The drugs that you were given will stay in your system until tomorrow so for the next 24 hours you should not:  Drive an automobile Make any legal decisions Drink any alcoholic beverage   You may resume regular meals tomorrow.  Today it is better to start with liquids and gradually work up to solid foods.  You may eat anything you prefer, but it is better to start with liquids, then soup and crackers, and gradually work up to solid foods.   Please notify your doctor immediately if you have any unusual bleeding, trouble breathing, redness and pain at the surgery site, drainage, fever, or pain not relieved by medication.    Additional Instructions:        Please contact your physician with any problems or Same Day Surgery at [phone], Monday through  Friday 6 am to 4 pm, or [place] at [facility] number at [phone].

## 2021-08-21 NOTE — Anesthesia Procedure Notes (Signed)
Procedure Name: LMA Insertion Date/Time: 08/21/2021 3:43 AM Performed by: Clyde Lundborg, CRNA Pre-anesthesia Checklist: Patient identified, Emergency Drugs available, Suction available and Patient being monitored Patient Re-evaluated:Patient Re-evaluated prior to induction Oxygen Delivery Method: Circle system utilized Preoxygenation: Pre-oxygenation with 100% oxygen Induction Type: IV induction Ventilation: Mask ventilation without difficulty LMA: LMA inserted LMA Size: 4.0 Tube type: Oral Number of attempts: 1 Placement Confirmation: positive ETCO2, breath sounds checked- equal and bilateral and CO2 detector Tube secured with: Tape Dental Injury: Teeth and Oropharynx as per pre-operative assessment

## 2021-08-21 NOTE — Transfer of Care (Signed)
Immediate Anesthesia Transfer of Care Note  Patient: Bailey [name]  Procedure(s) Performed: CYST REMOVAL (Scalp)  Patient Location: PACU  Anesthesia Type:General  Level of Consciousness: awake, alert  and oriented  Airway & Oxygen Therapy: Patient Spontanous Breathing  Post-op Assessment: Report given to RN and Post -op Vital signs reviewed and stable  Post vital signs: Reviewed and stable  Last Vitals:  Vitals Value Taken Time  BP    Temp    Pulse    Resp    SpO2      Last Pain:  Vitals:   08/21/21 0620  TempSrc: Temporal  PainSc: 0-No pain         Complications: No notable events documented.

## 2021-08-21 NOTE — Anesthesia Postprocedure Evaluation (Signed)
Anesthesia Post Note  Patient: Bailey [name]  Procedure(s) Performed: CYST REMOVAL (Scalp)  Patient location during evaluation: PACU Anesthesia Type: General Level of consciousness: awake and alert, oriented and patient cooperative Pain management: pain level controlled Vital Signs Assessment: post-procedure vital signs reviewed and stable Respiratory status: spontaneous breathing, nonlabored ventilation and respiratory function stable Cardiovascular status: blood pressure returned to baseline and stable Postop Assessment: adequate PO intake Anesthetic complications: no   No notable events documented.   Last Vitals:  Vitals:   08/21/21 0845 08/21/21 0854  BP: 111/60 (!) 122/55  Pulse: 67 76  Resp: 15 16  Temp: 36.5 C 36.6 C  SpO2: 100% 100%    Last Pain:  Vitals:   08/21/21 0854  TempSrc: Temporal  PainSc: 0-No pain                 Reed Breech

## 2021-08-21 NOTE — Op Note (Signed)
Pre-Op Dx: pilar cyst Post-Op Dx: same Anesthesia: LMA EBL: 9mL Complications:  none apparent Specimen: pilar cyst Procedure: excisional biopsy of pilar cyst Surgeon: Tonna Boehringer  Indications for procedure: See H&P  Description of Procedure:  Consent obtained, time out performed.  Patient placed in supine position.  Area sterilized and draped in usual position.  Local infused to area previously marked.  4cm incision made through dermis with 15blade and pilar cyst noted under scalp.  The  3cm x 3.5cm x 3.2cm pilar cyst then removed from surrounding tissue completely using electrocautery, passed off field pending pathology.  Wound hemostasis noted, then with 3-0 nylon in an interrupted fashion.  Wound then dressed with neosporin.  Pt tolerated procedure well, and transferred to PACU in stable condition. Sponge and instrument count correct at end of procedure.
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
## 2021-08-21 NOTE — Anesthesia Preprocedure Evaluation (Signed)
Anesthesia Evaluation  Patient identified by MRN, date of birth, ID band Patient awake    Reviewed: Allergy & Precautions, NPO status , Patient's Chart, lab work & pertinent test results  History of Anesthesia Complications Negative for: history of anesthetic complications  Airway Mallampati: III   Neck ROM: Full    Dental no notable dental hx.    Pulmonary asthma , COPD, Current Smoker (1/2 ppd)Patient did not abstain from smoking.,  COVID 05/2021 with residual cough   Pulmonary exam normal breath sounds clear to auscultation       Cardiovascular Exercise Tolerance: Good negative cardio ROS Normal cardiovascular exam Rhythm:Regular Rate:Normal     Neuro/Psych  Headaches, PSYCHIATRIC DISORDERS (PTSD) Anxiety Depression Bipolar Disorder    GI/Hepatic GERD  ,  Endo/Other  Obesity   Renal/GU Renal disease (nephrolithiasis)     Musculoskeletal   Abdominal   Peds  Hematology  (+) Blood dyscrasia, anemia ,   Anesthesia Other Findings   Reproductive/Obstetrics                             Anesthesia Physical Anesthesia Plan  ASA: 3  Anesthesia Plan: General   Post-op Pain Management:    Induction: Intravenous  PONV Risk Score and Plan: 2 and Ondansetron, Dexamethasone and Treatment may vary due to age or medical condition  Airway Management Planned: LMA  Additional Equipment:   Intra-op Plan:   Post-operative Plan: Extubation in OR  Informed Consent: I have reviewed the patients History and Physical, chart, labs and discussed the procedure including the risks, benefits and alternatives for the proposed anesthesia with the patient or authorized representative who has indicated his/her understanding and acceptance.     Dental advisory given  Plan Discussed with: CRNA  Anesthesia Plan Comments: (Patient consented for risks of anesthesia including but not limited to:  - adverse  reactions to medications - damage to eyes, teeth, lips or other oral mucosa - nerve damage due to positioning  - sore throat or hoarseness - damage to heart, brain, nerves, lungs, other parts of body or loss of life  Informed patient about role of CRNA in peri- and intra-operative care.  Patient voiced understanding.)        Anesthesia Quick Evaluation

## 2021-08-21 NOTE — Interval H&P Note (Signed)
History and Physical Interval Note:  08/21/2021 7:14 AM  Bailey [name]  has presented today for surgery, with the diagnosis of L72.11 pilar cyst.  The various methods of treatment have been discussed with the patient and family. After consideration of risks, benefits and other options for treatment, the patient has consented to  Procedure(s): CYST REMOVAL (N/A) as a surgical intervention.  The patient's history has been reviewed, patient examined, no change in status, stable for surgery.  I have reviewed the patient's chart and labs.  Questions were answered to the patient's satisfaction.     Anastyn Ayars Tonna Boehringer

## 2021-08-22 ENCOUNTER — Encounter: Payer: Self-pay | Admitting: Surgery

## 2021-08-24 LAB — SURGICAL PATHOLOGY

## 2021-09-01 ENCOUNTER — Other Ambulatory Visit: Payer: Self-pay

## 2021-09-01 ENCOUNTER — Ambulatory Visit (INDEPENDENT_AMBULATORY_CARE_PROVIDER_SITE_OTHER): Payer: Medicare HMO | Admitting: Licensed Clinical Social Worker

## 2021-09-01 DIAGNOSIS — F4310 Post-traumatic stress disorder, unspecified: Secondary | ICD-10-CM
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
## 2021-09-01 NOTE — Plan of Care (Signed)
  Problem: Reduce the negative impact trauma related symptoms have on social, occupational, and family functioning. Goal: LTG: Reduce frequency, intensity, and duration of PTSD symptoms so daily functioning is improved: Input needed on appropriate metric.  Per pt self report Outcome: Progressing Goal: STG: Bailey [name] WILL PRACTICE EMOTION REGULATION SKILLS 7 PER WEEK FOR THE NEXT 16 WEEKS Outcome: Progressing Intervention: Assess history of psychiatric trauma Intervention: Assist with relaxation techniques, as appropriate (deep breathing exercises, meditation, guided imagery) Intervention: Encourage effective communication techniques Intervention: Educate patient on: Stress management

## 2021-09-01 NOTE — Plan of Care (Signed)
  Problem: Reduce the negative impact trauma related symptoms have on social, occupational, and family functioning. Goal: LTG: Reduce frequency, intensity, and duration of PTSD symptoms so daily functioning is improved: Input needed on appropriate metric.  Per pt self report Outcome: Progressing Goal: STG: [employer] WILL PRACTICE EMOTION REGULATION SKILLS 7 PER WEEK FOR THE NEXT 16 WEEKS Outcome: Progressing Intervention: Encourage changes to improve social interaction Intervention: Assess history of psychiatric trauma Intervention: Assist with relaxation techniques, as appropriate (deep breathing exercises, meditation, guided imagery) Intervention: Encourage effective communication techniques Intervention: Educate patient on: Stress management Intervention: Encourage patient to identify triggers Intervention: Assist with relaxation techniques, as appropriate (deep breathing exercises, meditation, guided imagery)

## 2021-10-15 ENCOUNTER — Ambulatory Visit (INDEPENDENT_AMBULATORY_CARE_PROVIDER_SITE_OTHER): Payer: Medicare HMO | Admitting: Licensed Clinical Social Worker

## 2021-10-15 ENCOUNTER — Other Ambulatory Visit: Payer: Self-pay

## 2021-10-15 DIAGNOSIS — F4310 Post-traumatic stress disorder, unspecified: Secondary | ICD-10-CM | POA: Diagnosis not present

## 2021-10-15 NOTE — Plan of Care (Signed)
°  Problem: Reduce the negative impact trauma related symptoms have on social, occupational, and family functioning. Goal: LTG: Reduce frequency, intensity, and duration of PTSD symptoms so daily functioning is improved: Input needed on appropriate metric.  Per pt self report Outcome: Progressing Note: Pt is continuing to read self-help books and continuing to apply concepts and interventions learned into real-world scenarios. Pt reports an increase in overall self awareness. Goal: STG: Bailey [name] WILL PRACTICE EMOTION REGULATION SKILLS 7 PER WEEK FOR THE NEXT 16 WEEKS Outcome: Progressing Note: Pt is managing and regulating skills well. Intervention: Encourage patient to identify triggers Intervention: Work with patient to identify the major components of a recent episode of anxiety: physical symptoms, major thoughts and images, and major behaviors they experienced Intervention: Assist with relaxation techniques, as appropriate (deep breathing exercises, meditation, guided imagery) Intervention: Encourage effective communication techniques Intervention: Encourage self-care activities
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
## 2021-11-23 ENCOUNTER — Ambulatory Visit: Payer: Medicare HMO | Admitting: Licensed Clinical Social Worker

## 2021-11-23 ENCOUNTER — Other Ambulatory Visit: Payer: Self-pay

## 2021-12-29 ENCOUNTER — Ambulatory Visit
Admission: RE | Admit: 2021-12-29 | Discharge: 2021-12-29 | Disposition: A | Discharge status: Home / Self Care | Payer: 59 | Attending: Family Medicine | Admitting: Family Medicine

## 2021-12-29 ENCOUNTER — Ambulatory Visit
Admission: RE | Admit: 2021-12-29 | Discharge: 2021-12-29 | Disposition: A | Discharge status: Home / Self Care | Payer: 59 | Source: Ambulatory Visit | Attending: Family Medicine | Admitting: Family Medicine

## 2021-12-29 ENCOUNTER — Other Ambulatory Visit: Payer: Self-pay | Admitting: Family Medicine

## 2021-12-29 DIAGNOSIS — M25551 Pain in right hip: Secondary | ICD-10-CM

## 2021-12-29 DIAGNOSIS — M25552 Pain in left hip: Secondary | ICD-10-CM | POA: Diagnosis present

## 2021-12-30 ENCOUNTER — Other Ambulatory Visit: Payer: Self-pay

## 2021-12-30 ENCOUNTER — Ambulatory Visit (INDEPENDENT_AMBULATORY_CARE_PROVIDER_SITE_OTHER): Payer: 59 | Admitting: Licensed Clinical Social Worker

## 2021-12-30 DIAGNOSIS — F4310 Post-traumatic stress disorder, unspecified: Secondary | ICD-10-CM

## 2021-12-30 NOTE — Plan of Care (Signed)
?  Problem: Reduce the negative impact trauma related symptoms have on social, occupational, and family functioning. ?Goal: LTG: Reduce frequency, intensity, and duration of PTSD symptoms so daily functioning is improved: Input needed on appropriate metric.  Per pt self report ?Outcome: Not Progressing ?Note: Recent traumatizing event--within 24 hours ?Goal: STG: Bailey [name] WILL PRACTICE EMOTION REGULATION SKILLS 7 PER WEEK FOR THE NEXT 16 WEEKS ?Outcome: Progressing ?Intervention: Encourage verbalization of feelings/concerns/expectations ?Intervention: Encourage patient to identify triggers ?Intervention: EDUCATE Bailey [name] ON COMMON REACTIONS TO A TRAUMATIC EXPERIENCE ?Intervention: Assist with relaxation techniques, as appropriate (deep breathing exercises, meditation, guided imagery) ?  ?

## 2021-12-30 NOTE — Progress Notes (Signed)
Virtual Visit via Video Note ? ?I connected with Bailey [name] on 12/30/21 at  8:00 AM EDT by a video enabled telemedicine application and verified that I am speaking with the correct person using two identifiers. ? ?Location: ?Patient: home ?Provider: remote office [place], [place]) ?  ?I discussed the limitations of evaluation and management by telemedicine and the availability of in person appointments. The patient expressed understanding and agreed to proceed. ? ?I discussed the assessment and treatment plan with the patient. The patient was provided an opportunity to ask questions and all were answered. The patient agreed with the plan and demonstrated an understanding of the instructions. ?  ?The patient was advised to call back or seek an in-person evaluation if the symptoms worsen or if the condition fails to improve as anticipated. ? ?I provided 60 minutes of non-face-to-face time during this encounter. ? ? ?Kyira Volkert R Veniamin Kincaid, LCSW ? ? ?THERAPIST PROGRESS NOTE ? ?Session Time: 9-10a ? ?Participation Level: Active ? ?Behavioral Response: Neat and Well GroomedAlertAnxious and Depressed ? ?Type of Therapy: Individual Therapy ? ?ProgressTowards Goals: Not Progressing ? ?Treatment Goals addressed:  ?Problem: Reduce the negative impact trauma related symptoms have on social, occupational, and family functioning. ?Goal: LTG: Reduce frequency, intensity, and duration of PTSD symptoms so daily functioning is improved: Input needed on appropriate metric.  Per pt self report ?Outcome: Not Progressing ?Note: Recent traumatizing event--within 24 hours ?Goal: STG: Iyanna WILL PRACTICE EMOTION REGULATION SKILLS 7 PER WEEK FOR THE NEXT 16 WEEKS ?Outcome: Progressing ? ?Interventions:  ? ?Intervention: Encourage patient to identify triggers ? ?Intervention: Work with patient to identify the major components of a recent episode of anxiety: physical symptoms, major thoughts and images, and major behaviors they  experienced ? ?Intervention: Assist with relaxation techniques, as appropriate (deep breathing exercises, meditation, guided imagery) ? ?Intervention: Encourage effective communication techniques ? ?Intervention: Encourage self-care activities ? ?Summary: Bailey [name] is a 36 y.o. female who presents with continuing symptoms related to PTSD.  ? ?Allowed pt to explore and express thoughts and feelings associated with recent life situations and external stressors.  ? ?Reviewed importance of having "happy place" whenever pt feels overwhelmed with negative or traumatizing memories and/or flashbacks. Pt recently had a good day with her son at the car wash--this was today's positive memory wraparound after processing through trauma.  ? ?Continued recommendations are as follows: self care behaviors, positive social engagements, focusing on overall work/home/life balance, and focusing on positive physical and emotional wellness.  ? ?. Suicidal/Homicidal: No ? ?Therapist Response: Pt is continuing to apply interventions learned in session into daily life situations. Pt is currently on track to meet goals utilizing interventions mentioned above. Personal growth and progress noted. Treatment to continue as indicated.  ? ?Plan: Return again in 4 weeks. ? ?Encounter Diagnosis  ?Name Primary?  ? PTSD (post-traumatic stress disorder) Yes  ? ?Collaboration of Care: Other pt encouraged to continue care with psychiatrist of record, Dr. Vanetta Shawl ? ?Patient/Guardian was advised Release of Information must be obtained prior to any record release in order to collaborate their care with an outside provider. Patient/Guardian was advised if they have not already done so to contact the registration department to sign all necessary forms in order for [place] to release information regarding their care.  ? ?Consent: Patient/Guardian gives verbal consent for treatment and assignment of benefits for services provided during this visit.  Patient/Guardian expressed understanding and agreed to proceed.  ? ? ?Kellyjo Edgren R Altie Savard, LCSW ?12/30/2021 ? ?
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
## 2022-01-04 ENCOUNTER — Telehealth (INDEPENDENT_AMBULATORY_CARE_PROVIDER_SITE_OTHER): Payer: 59 | Admitting: Psychiatry

## 2022-01-04 ENCOUNTER — Encounter: Payer: Self-pay | Admitting: Psychiatry

## 2022-01-04 ENCOUNTER — Other Ambulatory Visit: Payer: Self-pay

## 2022-01-04 DIAGNOSIS — F331 Major depressive disorder, recurrent, moderate: Secondary | ICD-10-CM | POA: Diagnosis not present

## 2022-01-04 DIAGNOSIS — F4310 Post-traumatic stress disorder, unspecified: Secondary | ICD-10-CM

## 2022-01-04 MED ORDER — CLONAZEPAM 0.5 MG PO TABS: 0.5000 mg | ORAL_TABLET | Freq: Every day | ORAL | 1 refills | Status: AC | PRN

## 2022-01-04 MED ORDER — VILAZODONE HCL 40 MG PO TABS: ORAL_TABLET | ORAL | 1 refills | Status: AC

## 2022-01-04 MED ORDER — PRAZOSIN HCL 2 MG PO CAPS: 2.0000 mg | ORAL_CAPSULE | Freq: Every day | ORAL | 1 refills | Status: AC

## 2022-01-04 NOTE — Patient Instructions (Signed)
Restarted being off of them 20 mg daily for 1 week, then 40 mg daily ?Restart prazosin 2 mg at night ?Start clonazepam 0.5 mg daily as needed for anxiety ?Next appointment: 5/16 at 10 AM ? ? ?The next visit will be in person visit. Please arrive 15 mins before the scheduled time.  ? ?[facility]  ?Address: [address], [place], [place] [address]   ?

## 2022-02-09 ENCOUNTER — Ambulatory Visit (INDEPENDENT_AMBULATORY_CARE_PROVIDER_SITE_OTHER): Payer: 59 | Admitting: Licensed Clinical Social Worker

## 2022-02-09 DIAGNOSIS — F331 Major depressive disorder, recurrent, moderate: Secondary | ICD-10-CM

## 2022-02-09 DIAGNOSIS — F4310 Post-traumatic stress disorder, unspecified: Secondary | ICD-10-CM

## 2022-02-09 DIAGNOSIS — F419 Anxiety disorder, unspecified: Secondary | ICD-10-CM | POA: Diagnosis not present

## 2022-02-09 NOTE — Progress Notes (Signed)
Virtual Visit via Video Note ? ?I connected with Bailey [name] on 02/09/22 at  8:00 AM EDT by a video enabled telemedicine application and verified that I am speaking with the correct person using two identifiers. ? ?Location: ?Patient: home ?Provider: remote office [place], [place]) ?  ?I discussed the limitations of evaluation and management by telemedicine and the availability of in person appointments. The patient expressed understanding and agreed to proceed. ? ?I discussed the assessment and treatment plan with the patient. The patient was provided an opportunity to ask questions and all were answered. The patient agreed with the plan and demonstrated an understanding of the instructions. ?  ?The patient was advised to call back or seek an in-person evaluation if the symptoms worsen or if the condition fails to improve as anticipated. ? ?I provided 40 minutes of non-face-to-face time during this encounter. ? ? ?Neha Waight R Taya Ashbaugh, LCSW ? ? ?THERAPIST PROGRESS NOTE ? ?Session Time: 6-546T ? ?Participation Level: Active ? ?Behavioral Response: Neat and Well GroomedAlertAnxious and Depressed ? ?Type of Therapy: Individual Therapy ? ?ProgressTowards Goals: Progressing ? ?Treatment Goals addressed:  ?Problem: Reduce the negative impact trauma related symptoms have on social, occupational, and family functioning. ?Goal: LTG: Reduce frequency, intensity, and duration of PTSD symptoms so daily functioning is improved: Input needed on appropriate metric.  Per pt self report ?Outcome: Progressing ?Goal: STG: [facility] WILL PRACTICE EMOTION REGULATION SKILLS 7 PER WEEK FOR THE NEXT 16 WEEKS ?Outcome: Progressing ?  ? ?Interventions:  ? ?Intervention: Encourage patient to identify triggers ? ?Intervention: Work with patient to identify the major components of a recent episode of anxiety: physical symptoms, major thoughts and images, and major behaviors they experienced ? ?Intervention: Assist with relaxation techniques,  as appropriate (deep breathing exercises, meditation, guided imagery) ? ?Intervention: Encourage effective communication techniques ? ?Intervention: Encourage self-care activities ? ?Summary: Bailey [name] is a 36 y.o. female who presents with continuing symptoms related to PTSD.  ? ?Allowed pt to explore and express thoughts and feelings associated with recent life situations and external stressors. Patient reports that she is having some concerns with the Viibrid--pt elects to discontinue the medication due to side effects at this time. Patient reports that She can tolerate other medications without any concerns. Discussed recent scenario where patient met with her biological father face to face for the first time in many years. Patient reports that she noticed a lot of things about her father that she never noticed before in the past, that may explain some of the inconsistencies in communication in the past. Patient reports that she has not heard from her father since that meeting, which has triggered some feelings of abandonment. Explored other reasons that could be contributing factors to her father's distance. Patient reports that she met a biological brother that is the same age as her son. Allowed patient safe space to explore some traumas from the past, and discussed how we cannot ?rescript? the past. Used Trauma focused approach to allow patient to see past as a series of photographs--accepting each one in recognizing that we can only make changes in the present. Encouraged continued focus on self-care and life balance. Allowed patient to share things that she is currently doing to keep balance, and self-care.  ? ?Continued recommendations are as follows: self care behaviors, positive social engagements, focusing on overall work/home/life balance, and focusing on positive physical and emotional wellness.  ? ?Suicidal/Homicidal: No ? ?Therapist Response: Pt is continuing to apply interventions learned in  session into daily life situations. Pt is currently on track to meet goals utilizing interventions mentioned above. Personal growth and progress noted. Treatment to continue as indicated.  ? ?Plan: Return again in 4 weeks. Pt wanting referral to psychiatrist for med management--encouraged pt to call [facility]. ? ?Encounter Diagnoses  ?Name Primary?  ? Anxiety Yes  ? PTSD (post-traumatic stress disorder)   ? MDD (major depressive disorder), recurrent episode, moderate ([place])   ? ? ?Collaboration of Care: Other pt encouraged to continue care with psychiatrist of record, Dr. Modesta Messing  Referral to [facility] ? ?Patient/Guardian was advised Release of Information must be obtained prior to any record release in order to collaborate their care with an outside provider. Patient/Guardian was advised if they have not already done so to contact the registration department to sign all necessary forms in order for [place] to release information regarding their care.  ? ?Consent: Patient/Guardian gives verbal consent for treatment and assignment of benefits for services provided during this visit. Patient/Guardian expressed understanding and agreed to proceed.  ? ? ?Halena Mohar R Ryle Buscemi, LCSW ?02/09/2022 ? ?

## 2022-02-09 NOTE — Plan of Care (Signed)
?  Problem: Reduce the negative impact trauma related symptoms have on social, occupational, and family functioning. ?Goal: LTG: Reduce frequency, intensity, and duration of PTSD symptoms so daily functioning is improved: Input needed on appropriate metric.  Per pt self report ?Outcome: Progressing ?Goal: STG: [facility] WILL PRACTICE EMOTION REGULATION SKILLS 7 PER WEEK FOR THE NEXT 16 WEEKS ?Outcome: Progressing ?  ?
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
## 2022-03-02 ENCOUNTER — Ambulatory Visit: Payer: 59 | Admitting: Psychiatry

## 2022-03-25 ENCOUNTER — Ambulatory Visit (INDEPENDENT_AMBULATORY_CARE_PROVIDER_SITE_OTHER): Payer: 59 | Admitting: Licensed Clinical Social Worker

## 2022-03-25 DIAGNOSIS — F4310 Post-traumatic stress disorder, unspecified: Secondary | ICD-10-CM | POA: Diagnosis not present
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
## 2022-03-25 NOTE — Plan of Care (Signed)
  Problem: Reduce the negative impact trauma related symptoms have on social, occupational, and family functioning. Goal: LTG: Reduce frequency, intensity, and duration of PTSD symptoms so daily functioning is improved: Input needed on appropriate metric.  Per pt self report Outcome: Progressing Goal: STG: Bailey [name] WILL PRACTICE EMOTION REGULATION SKILLS 7 PER WEEK FOR THE NEXT 16 WEEKS Outcome: Progressing Intervention: REVIEW PLEASE SKILLS (TREAT PHYSICAL ILLNESS, BALANCE EATING, AVOID MOOD-ALTERING SUBSTANCES, BALANCE SLEEP AND GET EXERCISE) WITH Bailey [name] Note: Reviewed Intervention: Assist with relaxation techniques, as appropriate (deep breathing exercises, meditation, guided imagery) Note: Reviewed Intervention: Encourage verbalization of feelings/concerns/expectations Note: Explored/allowed pt to explore thoughts and feelings

## 2022-05-10 ENCOUNTER — Ambulatory Visit (INDEPENDENT_AMBULATORY_CARE_PROVIDER_SITE_OTHER): Payer: 59 | Admitting: Licensed Clinical Social Worker

## 2022-05-10 DIAGNOSIS — F4310 Post-traumatic stress disorder, unspecified: Secondary | ICD-10-CM | POA: Diagnosis not present

## 2022-05-10 DIAGNOSIS — F331 Major depressive disorder, recurrent, moderate: Secondary | ICD-10-CM | POA: Diagnosis not present
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
## 2022-05-10 NOTE — Plan of Care (Signed)
  Problem: Reduce the negative impact trauma related symptoms have on social, occupational, and family functioning. Goal: LTG: Reduce frequency, intensity, and duration of PTSD symptoms so daily functioning is improved: Input needed on appropriate metric.  Per pt self report Outcome: Progressing Goal: STG: Bailey [name] WILL PRACTICE EMOTION REGULATION SKILLS 7 PER WEEK FOR THE NEXT 16 WEEKS Outcome: Progressing Intervention: Assist with relaxation techniques, as appropriate (deep breathing exercises, meditation, guided imagery) Note: explored Intervention: Manage signs of labile or escalating emotions Note: Explored and reviewed management techniques Intervention: Encourage patient to identify triggers Note: Assisted with identification

## 2022-06-14 ENCOUNTER — Ambulatory Visit (INDEPENDENT_AMBULATORY_CARE_PROVIDER_SITE_OTHER): Payer: 59 | Admitting: Licensed Clinical Social Worker

## 2022-06-14 DIAGNOSIS — F4310 Post-traumatic stress disorder, unspecified: Secondary | ICD-10-CM | POA: Diagnosis not present

## 2022-06-14 DIAGNOSIS — F419 Anxiety disorder, unspecified: Secondary | ICD-10-CM

## 2022-06-14 NOTE — Progress Notes (Signed)
Virtual Visit via Video Note  I connected with Bailey [name] on 06/14/22 at  9:00 AM EDT by a video enabled telemedicine application and verified that I am speaking with the correct person using two identifiers.  Location: Patient: home Provider: remote office [place], [place])   I discussed the limitations of evaluation and management by telemedicine and the availability of in person appointments. The patient expressed understanding and agreed to proceed.  I discussed the assessment and treatment plan with the patient. The patient was provided an opportunity to ask questions and all were answered. The patient agreed with the plan and demonstrated an understanding of the instructions.   The patient was advised to call back or seek an in-person evaluation if the symptoms worsen or if the condition fails to improve as anticipated.  I provided 60 minutes of non-face-to-face time during this encounter.   Sosie Gato R Nicolina Hirt, LCSW   THERAPIST PROGRESS NOTE  Session Time: 9-10a  Participation Level: Active  Behavioral Response: Neat and Well GroomedAlertAnxious and Depressed  Type of Therapy: Individual Therapy  ProgressTowards Goals: Progressing  Treatment Goals addressed:  Problem: Reduce the negative impact trauma related symptoms have on social, occupational, and family functioning. Goal: LTG: Reduce frequency, intensity, and duration of PTSD symptoms so daily functioning is improved: Input needed on appropriate metric.  Per pt self report Outcome: Progressing Goal: STG: Kameela WILL PRACTICE EMOTION REGULATION SKILLS 7 PER WEEK FOR THE NEXT 16 WEEKS Outcome: Progressing Intervention: Encourage verbalization of feelings/concerns/expectations Note: Explored current external stressors and idenfying triggers/coping skills  Intervention: Manage signs of labile or escalating emotions Note: Assisted with identifying triggers  Interventions: Trauma focused, CBT  Summary: [name] is a 36 y.o. female who presents with continuing symptoms related to PTSD.   Themes of the Session: The main themes of the session were: coping with relational frustrations; exploration of relationship with mother; exploration of relationships with children; exploration of multiple traumatic events and cumulative trauma in childhood/adolescence; development of self-care and self-preservation; exploration and evaluation of target behaviors and areas of concern; discussion of medication and its impact; and awareness and exploration of perfectionistic tendencies.  Therapeutic Intervention: The main therapeutic interventions consisted of: supportive techniques; a focus on helping the patient expand problem solving capacities; an emphasis on strengthening self-care and self-preservation; an emphasis on evaluation and exploration of problematic automatic thoughts; addressing and working to help patient overcome the tendency to engage in catastrophization; and exploration of underlying schemas that impact thought and behavior.  Discussed daughter starting dance, son's rebellious behavior, relationship w/ mother both present and past, concerns about husband's health.   Continued to discuss trauma--identified triggers and redirected conversation back to "safe space".   Continued recommendations are as follows: self care behaviors, positive social engagements, focusing on overall work/home/life balance, and focusing on positive physical and emotional wellness.   Suicidal/Homicidal: No  Therapist Response: Pt is continuing to apply interventions learned in session into daily life situations. Pt is currently on track to meet goals utilizing interventions mentioned above. Personal growth and progress noted. Treatment to continue as indicated.   Developments continue in the areas of family and relational functioning. The patient shows an enhanced capacity for parenting and improved parenting of children.  The patient continues in process of re-working and alleviation of traumatic pain and stress.  Plan: Return again in 4 weeks.   Encounter Diagnoses  Name Primary?   PTSD (post-traumatic stress disorder) Yes   Anxiety     Collaboration  of Care: Other pt encouraged to continue care with psychiatrist of record, Dr. Vanetta Shawl    Patient/Guardian was advised Release of Information must be obtained prior to any record release in order to collaborate their care with an outside provider. Patient/Guardian was advised if they have not already done so to contact the registration department to sign all necessary forms in order for [place] to release information regarding their care.   Consent: Patient/Guardian gives verbal consent for treatment and assignment of benefits for services provided during this visit. Patient/Guardian expressed understanding and agreed to proceed.    Ernest [name] Beryl Balz, LCSW 06/14/2022

## 2022-06-14 NOTE — Plan of Care (Signed)
  Problem: Reduce the negative impact trauma related symptoms have on social, occupational, and family functioning. Goal: LTG: Reduce frequency, intensity, and duration of PTSD symptoms so daily functioning is improved: Input needed on appropriate metric.  Per pt self report Outcome: Progressing Goal: STG: Bailey [name] WILL PRACTICE EMOTION REGULATION SKILLS 7 PER WEEK FOR THE NEXT 16 WEEKS Outcome: Progressing Intervention: Encourage verbalization of feelings/concerns/expectations Note: Explored current external stressors and idenfying triggers/coping skills  Intervention: Manage signs of labile or escalating emotions Note: Assisted with identifying triggers

## 2022-07-07 ENCOUNTER — Telehealth: Payer: Self-pay

## 2022-07-07 NOTE — Telephone Encounter (Signed)
Per Dr. Delaine Lame she would like fort the patient to have a fungal culture and identify whether they yeast is candida albicans, or glabarata or other kind before she is seen by her.  I have relayed this message to [place] with OBGYN. I attempted to contact the patient to let her know that we are needing to cancel the appointment for tomorrow with Dr. Delaine Lame.  Patient did not answer and I left a secure voicemail letting know that the appointment will be canceled and OBGYN will be reaching our to her. Bakari Nikolai T Brooks Sailors

## 2022-07-08 ENCOUNTER — Ambulatory Visit: Payer: 59 | Admitting: Infectious Diseases

## 2022-07-27 ENCOUNTER — Ambulatory Visit (INDEPENDENT_AMBULATORY_CARE_PROVIDER_SITE_OTHER): Payer: Self-pay | Admitting: Licensed Clinical Social Worker

## 2022-07-27 ENCOUNTER — Telehealth (HOSPITAL_COMMUNITY): Payer: Self-pay | Admitting: Licensed Clinical Social Worker

## 2022-07-27 DIAGNOSIS — Z91199 Patient's noncompliance with other medical treatment and regimen due to unspecified reason: Secondary | ICD-10-CM

## 2022-07-27 NOTE — Telephone Encounter (Signed)
LCSW counselor attempted to connect with patient for scheduled appointment via MyChart video text request x 2 and email request with no response; also attempted to connect via phone without success. LCSW counselor attempted to leave voice mail unsuccessfully.  Attempt 1: Text and email: 11:05a  Attempt 2: Text and email: 11:09a  Attempt 3: phone call: 11:15a.  Attempted to leave message but could not due to voice mail box full.  Visit will be coded as no show

## 2022-07-27 NOTE — Progress Notes (Signed)
LCSW counselor attempted to connect with patient for scheduled appointment via MyChart video text request x 2 and email request with no response; also attempted to connect via phone without success. LCSW counselor attempted to leave voice mail unsuccessfully.  Attempt 1: Text and email: 11:05a  Attempt 2: Text and email: 11:09a  Attempt 3: phone call: 11:15a.  Attempted to leave message but could not due to voice mail box full.  Visit will be coded as no show  

## 2022-08-31 ENCOUNTER — Ambulatory Visit (INDEPENDENT_AMBULATORY_CARE_PROVIDER_SITE_OTHER): Payer: 59 | Admitting: Licensed Clinical Social Worker

## 2022-08-31 DIAGNOSIS — F419 Anxiety disorder, unspecified: Secondary | ICD-10-CM | POA: Diagnosis not present

## 2022-08-31 DIAGNOSIS — F4310 Post-traumatic stress disorder, unspecified: Secondary | ICD-10-CM

## 2022-08-31 DIAGNOSIS — F331 Major depressive disorder, recurrent, moderate: Secondary | ICD-10-CM | POA: Diagnosis not present

## 2022-08-31 NOTE — Plan of Care (Signed)
  Problem: Reduce the negative impact trauma related symptoms have on social, occupational, and family functioning. Goal: LTG: Reduce frequency, intensity, and duration of PTSD symptoms so daily functioning is improved: Input needed on appropriate metric.  Per pt self report Outcome: Progressing Goal: STG: Bailey [name] WILL PRACTICE EMOTION REGULATION SKILLS 7 PER WEEK FOR THE NEXT 16 WEEKS Outcome: Progressing Intervention: REVIEW PLEASE SKILLS (TREAT PHYSICAL ILLNESS, BALANCE EATING, AVOID MOOD-ALTERING SUBSTANCES, BALANCE SLEEP AND GET EXERCISE) WITH Bailey [name] Note: Reviewed  Intervention: Encourage verbalization of feelings/concerns/expectations Note: Allowed pt to explore/express

## 2022-08-31 NOTE — Progress Notes (Addendum)
Virtual Visit via Video Note  I connected with Bailey [name] on 08/31/22 at 11:00 AM EST by a video enabled telemedicine application and verified that I am speaking with the correct person using two identifiers.  Location: Patient: home Provider: remote office [place], [place])   I discussed the limitations of evaluation and management by telemedicine and the availability of in person appointments. The patient expressed understanding and agreed to proceed.  I discussed the assessment and treatment plan with the patient. The patient was provided an opportunity to ask questions and all were answered. The patient agreed with the plan and demonstrated an understanding of the instructions.   The patient was advised to call back or seek an in-person evaluation if the symptoms worsen or if the condition fails to improve as anticipated.  I provided 60 minutes of non-face-to-face time during this encounter.   Bailey [name] R Bailey Fross, LCSW   THERAPIST PROGRESS NOTE  Session Time: 11a-12p  Participation Level: Active  Behavioral Response: Neat and Well GroomedAlertAnxious and Depressed  Type of Therapy: Individual Therapy  ProgressTowards Goals: Progressing  Treatment Goals addressed:    Problem: Reduce the negative impact trauma related symptoms have on social, occupational, and family functioning. Goal: LTG: Reduce frequency, intensity, and duration of PTSD symptoms so daily functioning is improved: Input needed on appropriate metric.  Per pt self report Outcome: Progressing Goal: STG: [place] WILL PRACTICE EMOTION REGULATION SKILLS 7 PER WEEK FOR THE NEXT 16 WEEKS Outcome: Progressing Intervention: REVIEW PLEASE SKILLS (TREAT PHYSICAL ILLNESS, BALANCE EATING, AVOID MOOD-ALTERING SUBSTANCES, BALANCE SLEEP AND GET EXERCISE) WITH Bailey [name] Note: Reviewed  Intervention: Encourage verbalization of feelings/concerns/expectations Note: Allowed pt to explore/express      Interventions:  Trauma focused, CBT  Summary: Bailey [name] is a 36 y.o. female who presents with continuing symptoms related to PTSD.  Patient reports that one of her biggest external stressors is the recent estrangement from her fiance. Patient reports that he had been sick, and was on disability, and was cleared by the doctor to return to work . Patient reports that he made the decision not to return to work, which would put patient in a financial bind because there will be no money at all coming in to support the household expenses. Patient reports that this all went down recently, and she is struggling with her feelings. Patient reports that it did get to a point when she was going to multiple food banks to get enough food to fill in the gaps. Provided patient with local food assistance resources in [place] and [place].   Explored relationship with mother--patient reports that she recently had a visit with her mother where her mother confided to her all of the bad things that have been going on with her marriage throughout the years. Patient reports that she was very supportive and empathetic towards her mother. Patient reports that since that time her mother has come to visit her two times. Patient reports that this is her mother's pattern, and she will often seek out a relationship around holiday time, and then disappear around the first of the year. Allowed patient to reframe perspective about relationship with mother. Patient reports that she has been so hurt by her father in the past, that she is very guarded about relationship with mother.  Allow patient to identify coping skills that have been helpful for managing stress, anxiety, and help with panicky symptoms. Discussed coping skills that help manage depression symptoms. Patient reflects understanding and willingness to cooperate  Continued recommendations are as follows: self care behaviors, positive social engagements, focusing on overall  work/home/life balance, and focusing on positive physical and emotional wellness.   Suicidal/Homicidal: No  Therapist Response: Pt is continuing to apply interventions learned in session into daily life situations. Pt is currently on track to meet goals utilizing interventions mentioned above. Personal growth and progress noted. Treatment to continue as indicated.   Developments continue in the areas of family and relational functioning. The patient shows an enhanced capacity for parenting and improved parenting of children. The patient continues in process of re-working and alleviation of traumatic pain and stress.  Plan: Return again in 4 weeks.   Encounter Diagnoses  Name Primary?   PTSD (post-traumatic stress disorder) Yes   MDD (major depressive disorder), recurrent episode, moderate ([facility])    Anxiety     Collaboration of Care: Other pt encouraged to continue care with psychiatrist of record, Dr. Vanetta Shawl    Patient/Guardian was advised Release of Information must be obtained prior to any record release in order to collaborate their care with an outside provider. Patient/Guardian was advised if they have not already done so to contact the registration department to sign all necessary forms in order for Bailey [name] to release information regarding their care.   Consent: Patient/Guardian gives verbal consent for treatment and assignment of benefits for services provided during this visit. Patient/Guardian expressed understanding and agreed to proceed.    Bailey [name] [place], LCSW 08/31/2022

## 2022-09-30 ENCOUNTER — Ambulatory Visit (INDEPENDENT_AMBULATORY_CARE_PROVIDER_SITE_OTHER): Payer: 59 | Admitting: Licensed Clinical Social Worker

## 2022-09-30 DIAGNOSIS — F331 Major depressive disorder, recurrent, moderate: Secondary | ICD-10-CM

## 2022-09-30 DIAGNOSIS — F419 Anxiety disorder, unspecified: Secondary | ICD-10-CM

## 2022-09-30 DIAGNOSIS — F4310 Post-traumatic stress disorder, unspecified: Secondary | ICD-10-CM

## 2022-09-30 NOTE — Progress Notes (Signed)
Virtual Visit via Video Note  I connected with Bailey [name] on 09/30/22 at  8:00 AM EST by a video enabled telemedicine application and verified that I am speaking with the correct person using two identifiers.  Location: Patient: home Provider: remote office [place], [place])   I discussed the limitations of evaluation and management by telemedicine and the availability of in person appointments. The patient expressed understanding and agreed to proceed.  I discussed the assessment and treatment plan with the patient. The patient was provided an opportunity to ask questions and all were answered. The patient agreed with the plan and demonstrated an understanding of the instructions.   The patient was advised to call back or seek an in-person evaluation if the symptoms worsen or if the condition fails to improve as anticipated.  I provided 50 minutes of non-face-to-face time during this encounter.   [place], LCSW   THERAPIST PROGRESS NOTE  Session Time: 8-850a  Participation Level: Active  Behavioral Response: Neat and Well GroomedAlertAnxious and Depressed  Type of Therapy: Individual Therapy  ProgressTowards Goals: Progressing  Treatment Goals addressed:   LTG: Reduce frequency, intensity, and duration of PTSD symptoms so daily functioning is improved: Input needed on appropriate metric. Per pt self report    Interventions: Trauma focused, CBT  Summary: Bailey [name] is a 36 y.o. female who presents with continuing symptoms related to PTSD. Pt reports that she is compliant with her medication and is currently taking Zoloft managed by her PCP. Pt requests another psychiatrist referral--does not want to see Dr. Modesta Messing again.   Allowed pt to explore and express thoughts and feelings associated with recent life situations and external stressors.Patient reports that she this feeling more anxiety and more depression symptoms due to recent estrangement between  herself and her husband. Patient reports that she did make her husband move out of the home since he was not contributing to any of the bills financially. Patient reports that she found out that he started receiving disability benefits, but did not want to give her money to help buy their daughter's Christmas presents. The patient reports that her mother helped purchase Christmas presents for their daughter and son. Patient reports that she also found out that her husband was cheating with someone that he met online. Patient reports that she feels very numb, and had not shown any emotion about this prior to her counseling episode (pt visibly upset, crying).   patient reports that she has been not feeling well (stomach virus) and currently it seems her daughter is sick as well (stomach virus). patient states that her daughter is staying with her father at her paternal grandmother's home.  Patient reports that she is using her coping skills to manage symptoms of depression and anxiety, but still feels down at times. Patient reports that relationship with her mother is OK currently, and that she is appreciative of her financial support at this time. Patient reports that her son has goals of entering [employer] in the summer.  Patient denies any suicidal ideation, homicidal ideation, or any perceptual disturbances at time of session.   Continued recommendations are as follows: self care behaviors, positive social engagements, focusing on overall work/home/life balance, and focusing on positive physical and emotional wellness.   Suicidal/Homicidal: No  Therapist Response: Pt is continuing to apply interventions learned in session into daily life situations. Pt is currently on track to meet goals utilizing interventions mentioned above. Personal growth and progress noted. Treatment to continue  as indicated.   Developments continue in the areas of family and relational functioning. The patient shows an  enhanced capacity for parenting and improved parenting of children. The patient continues in process of re-working and alleviation of traumatic pain and stress.  Plan: Return again in 4 weeks.   Encounter Diagnoses  Name Primary?   PTSD (post-traumatic stress disorder) Yes   MDD (major depressive disorder), recurrent episode, moderate ([place])    Anxiety     Collaboration of Care: Other pt encouraged to continue care with psychiatrist of record, Dr. Modesta Messing    Patient/Guardian was advised Release of Information must be obtained prior to any record release in order to collaborate their care with an outside provider. Patient/Guardian was advised if they have not already done so to contact the registration department to sign all necessary forms in order for [place] to release information regarding their care.   Consent: Patient/Guardian gives verbal consent for treatment and assignment of benefits for services provided during this visit. Patient/Guardian expressed understanding and agreed to proceed.   Active     Reduce the negative impact trauma related symptoms have on social, occupational, and family functioning.     LTG: Reduce frequency, intensity, and duration of PTSD symptoms so daily functioning is improved: Input needed on appropriate metric.  Per pt self report (Not Progressing)     Start:  08/03/21    Expected End:  01/14/23         STG: Bailey [name] WILL PRACTICE EMOTION REGULATION SKILLS 7 PER WEEK FOR THE NEXT 16 WEEKS (Progressing)     Start:  08/03/21    Expected End:  01/14/23         REVIEW PLEASE SKILLS (TREAT PHYSICAL ILLNESS, BALANCE EATING, AVOID MOOD-ALTERING SUBSTANCES, BALANCE SLEEP AND GET EXERCISE) WITH Bailey [name]     Start:  03/25/22          Intervention Note     Reviewed          Assist with relaxation techniques, as appropriate (deep breathing exercises, meditation, guided imagery)     Start:  03/25/22          Intervention Note     Reviewed           Encourage verbalization of feelings/concerns/expectations     Start:  03/25/22       Intervention Note     Allowed pt to explore/express         Manage signs of labile or escalating emotions     Start:  03/25/22          Intervention Note     Assisted with identifying triggers         Encourage patient to identify triggers     Start:  03/25/22          Intervention Note     Assisted with identification            [place], LCSW 09/30/2022

## 2022-11-09 ENCOUNTER — Ambulatory Visit (INDEPENDENT_AMBULATORY_CARE_PROVIDER_SITE_OTHER): Payer: 59 | Admitting: Licensed Clinical Social Worker

## 2022-11-09 DIAGNOSIS — F331 Major depressive disorder, recurrent, moderate: Secondary | ICD-10-CM

## 2022-11-09 DIAGNOSIS — F419 Anxiety disorder, unspecified: Secondary | ICD-10-CM | POA: Diagnosis not present

## 2022-11-09 DIAGNOSIS — F4310 Post-traumatic stress disorder, unspecified: Secondary | ICD-10-CM

## 2022-11-09 NOTE — Progress Notes (Signed)
Virtual Visit via Video Note  I connected with Bailey [name] on 11/09/22 at 10:00 AM EST by a video enabled telemedicine application and verified that I am speaking with the correct person using two identifiers.  Location: Patient: home Provider: remote office [place], [place])   I discussed the limitations of evaluation and management by telemedicine and the availability of in person appointments. The patient expressed understanding and agreed to proceed.  I discussed the assessment and treatment plan with the patient. The patient was provided an opportunity to ask questions and all were answered. The patient agreed with the plan and demonstrated an understanding of the instructions.   The patient was advised to call back or seek an in-person evaluation if the symptoms worsen or if the condition fails to improve as anticipated.  I provided 53 minutes of non-face-to-face time during this encounter.   [place], LCSW   THERAPIST PROGRESS NOTE  Session Time: [id number]  Participation Level: Active  Behavioral Response: Neat and Well GroomedAlertAnxious and Depressed  Type of Therapy: Individual Therapy  ProgressTowards Goals: Progressing  Treatment Goals addressed: Reduce the negative impact trauma related symptoms have on social, occupational, and family functioning per pt self report 3 out of 5 sessions documented.  Interventions: Trauma focused, CBT  Summary: Bailey [name] is a 37 y.o. female who presents with continuing symptoms related to PTSD. Pt reports that overall mood fluctuates at times and that she is experiencing insomnia a lot.  Allowed pt to explore and express thoughts and feelings associated with recent life situations and external stressors.Allowed pt to explore recent surgery and recovery process. Pt reports that her ex helped assist her (and their daughter) through the recovery process. Discussed recent trauma triggers: holidays, thinking about her  father (cancer), and discussed relationship with parents and son, Denyse Amass.   Discussed recent breakup--pt states that ex did a good job with helping her in the time of recovery, but pt had a hard time when it was time for him to "move back" to his moms once pt was feeling better. Pt states that he still does not have any income and financially she cannot afford to support him and take care of his needs. Pt states that he is often disrespectful to her "he calls me out of my name". Pt also states that he tries to be manipulative to get her to allow him to continue staying with her.   Explored relationship with father (cancer).  Pt has concerns and verbally checks on him on a regular basis.   Reviewed importance of continuing to set limits and boundaries with others to allow her to feel safe/at peace. Discussed importance of self care behaviors.   Continued recommendations are as follows: self care behaviors, positive social engagements, focusing on overall work/home/life balance, and focusing on positive physical and emotional wellness.   Suicidal/Homicidal: No  Therapist Response: Pt is continuing to apply interventions learned in session into daily life situations. Pt is currently on track to meet goals utilizing interventions mentioned above. Personal growth and progress noted. Treatment to continue as indicated.   Developments continue in the areas of family and relational functioning. The patient shows an enhanced capacity for parenting and improved parenting of children. The patient continues in process of re-working and alleviation of traumatic pain and stress.  Plan: Return again in 4 weeks.   Encounter Diagnoses  Name Primary?   PTSD (post-traumatic stress disorder) Yes   MDD (major depressive disorder), recurrent episode, moderate ([place])  Anxiety    Collaboration of Care: Other pt encouraged to continue care with psychiatrist of record, Dr. Modesta Messing  Referred pt to [place] if she wishes to see someone outside of the [facility].   Patient/Guardian was advised Release of Information must be obtained prior to any record release in order to collaborate their care with an outside provider. Patient/Guardian was advised if they have not already done so to contact the registration department to sign all necessary forms in order for [place] to release information regarding their care.   Consent: Patient/Guardian gives verbal consent for treatment and assignment of benefits for services provided during this visit. Patient/Guardian expressed understanding and agreed to proceed.    [place], LCSW 11/09/2022

## 2022-12-06 ENCOUNTER — Ambulatory Visit (INDEPENDENT_AMBULATORY_CARE_PROVIDER_SITE_OTHER): Payer: 59 | Admitting: Licensed Clinical Social Worker

## 2022-12-06 DIAGNOSIS — G4700 Insomnia, unspecified: Secondary | ICD-10-CM

## 2022-12-06 DIAGNOSIS — F419 Anxiety disorder, unspecified: Secondary | ICD-10-CM | POA: Diagnosis not present

## 2022-12-06 DIAGNOSIS — F4310 Post-traumatic stress disorder, unspecified: Secondary | ICD-10-CM | POA: Diagnosis not present

## 2022-12-06 NOTE — Progress Notes (Unsigned)
Virtual Visit via Video Note  I connected with Bailey [name] on 12/06/22 at  1:00 PM EST by a video enabled telemedicine application and verified that I am speaking with the correct person using two identifiers.  Location: Patient: home Provider: remote office [place], [place])   I discussed the limitations of evaluation and management by telemedicine and the availability of in person appointments. The patient expressed understanding and agreed to proceed.  I discussed the assessment and treatment plan with the patient. The patient was provided an opportunity to ask questions and all were answered. The patient agreed with the plan and demonstrated an understanding of the instructions.   The patient was advised to call back or seek an in-person evaluation if the symptoms worsen or if the condition fails to improve as anticipated.  I provided 40 minutes of non-face-to-face time during this encounter.  Bailey [name] R Ryon Lay[place], LCSW  THERAPIST PROGRESS NOTE  Session Time: 1-140p  Participation Level: Active  Behavioral Response: Neat and Well GroomedAlertAnxious and Depressed  Type of Therapy: Individual Therapy  ProgressTowards Goals: Progressing  Treatment Goals addressed: Reduce the negative impact trauma related symptoms have on social, occupational, and family functioning per pt self report 3 out of 5 sessions documented.  Interventions: Trauma focused, CBT  Summary: Bailey [name] is a 37 y.o. female who presents with continuing symptoms related to PTSD. Pt reports that overall mood fluctuates at times and that she is experiencing insomnia a lot.  Patient states that she is compliant with her medication, and is still uncertain as to whether she wants to continue with current psychiatrist or if she wants to seek an external psychiatric provider.  Allowed pt to explore and express thoughts and feelings associated with recent life situations and external stressors.  Patient reports  continuing stress associated with her finding out that the house that she is currently residing in is going up for sale.  Patient states that the current owner's trying to find an investor who will allow patient to stay in the home--patient is aware there is no guarantee of this.  Patient states it has been very hard on her because she is trying hard to comply with requests and has had the house very clean, but then appraiser's and interested individuals are not showing up.  Allowed patient to identify next steps for her, and to begin the process of formulating a plan B in case the house does get sold and she will have to move out.  Patient states that she is continuing to experience pain associated from her recent surgery.  Patient reports that the pain does limit her activity at times.  Encouraged patient to continue reaching out to primary care physician for her overall health and pain control.   Patient states that she did visit her father after her last counseling session, and patient feels good about it.  Patient states that her father made the comment that he has some land and has a trailer that is sitting on the land that the patient could possibly move into if something happens with her house.  Patient states that she discussed with her father the fact that her mother does not want to help her at all, and patient does not want to ask her mother for any help.  Patient states that her ex has moved back into the home.  Patient states that he is now receiving short-term disability, so he is now able to help financially.  Patient states that this is  a big relief for her, because she is not capable of taking care of all the bills and feeding him to.  Allowed patient to explore needs, and discussed community resources.  Continued recommendations are as follows: self care behaviors, positive social engagements, focusing on overall work/home/life balance, and focusing on positive physical and emotional  wellness.   Suicidal/Homicidal: No  Therapist Response: Pt is continuing to apply interventions learned in session into daily life situations. Pt is currently on track to meet goals utilizing interventions mentioned above. Personal growth and progress noted. Treatment to continue as indicated.   Developments continue in the areas of family and relational functioning. The patient continues in process of re-working and alleviation of traumatic pain and stress.  Patient is gaining greater awareness, understanding, and expression of underlying emotions.  Plan: Return again in 4 weeks.   Encounter Diagnoses  Name Primary?   PTSD (post-traumatic stress disorder) Yes   Anxiety    Insomnia, unspecified type    Collaboration of Care: Other pt encouraged to continue care with psychiatrist of record, Dr. Modesta Messing  Referred pt to [place] if she wishes to see someone outside of the [facility].   Patient/Guardian was advised Release of Information must be obtained prior to any record release in order to collaborate their care with an outside provider. Patient/Guardian was advised if they have not already done so to contact the registration department to sign all necessary forms in order for [place] to release information regarding their care.   Consent: Patient/Guardian gives verbal consent for treatment and assignment of benefits for services provided during this visit. Patient/Guardian expressed understanding and agreed to proceed.    [place], LCSW 12/06/2022

## 2023-01-10 ENCOUNTER — Ambulatory Visit (HOSPITAL_COMMUNITY): Payer: 59 | Admitting: Licensed Clinical Social Worker

## 2023-01-10 NOTE — Progress Notes (Addendum)
Connected with p via video t for 1pm appointment today--pt is currently in the ED for evaluation of breathing concerns. Encouraged pt to call back when she is home for another appointment.

## 2023-02-03 ENCOUNTER — Ambulatory Visit (INDEPENDENT_AMBULATORY_CARE_PROVIDER_SITE_OTHER): Payer: 59 | Admitting: Licensed Clinical Social Worker

## 2023-02-03 DIAGNOSIS — F419 Anxiety disorder, unspecified: Secondary | ICD-10-CM

## 2023-02-03 DIAGNOSIS — F4310 Post-traumatic stress disorder, unspecified: Secondary | ICD-10-CM
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
## 2023-02-10 ENCOUNTER — Encounter (HOSPITAL_COMMUNITY): Payer: Self-pay | Admitting: Licensed Clinical Social Worker

## 2023-02-10 ENCOUNTER — Telehealth (HOSPITAL_COMMUNITY): Payer: Self-pay | Admitting: Licensed Clinical Social Worker

## 2023-02-10 NOTE — Telephone Encounter (Signed)
Could not leave message due to voice mailbox full

## 2023-02-10 NOTE — Telephone Encounter (Signed)
Spoke w/ Shanda Bumps and she provided verbal consent to release information to [employer]. Information will be faxed and a copy in pt chart.

## 2023-02-16 ENCOUNTER — Telehealth (HOSPITAL_COMMUNITY): Payer: Self-pay | Admitting: Licensed Clinical Social Worker

## 2023-02-16 NOTE — Telephone Encounter (Signed)
Spoke w/ pt re: verification of ESA letter through company. Pt reports that verification went through and no additional information needed from clinician at this point in time.

## 2023-02-28 ENCOUNTER — Ambulatory Visit (INDEPENDENT_AMBULATORY_CARE_PROVIDER_SITE_OTHER): Payer: 59 | Admitting: Licensed Clinical Social Worker

## 2023-02-28 DIAGNOSIS — F4310 Post-traumatic stress disorder, unspecified: Secondary | ICD-10-CM | POA: Diagnosis not present
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
## 2023-03-21 ENCOUNTER — Ambulatory Visit (INDEPENDENT_AMBULATORY_CARE_PROVIDER_SITE_OTHER): Payer: 59 | Admitting: Licensed Clinical Social Worker

## 2023-03-21 DIAGNOSIS — F4310 Post-traumatic stress disorder, unspecified: Secondary | ICD-10-CM

## 2023-03-21 DIAGNOSIS — F419 Anxiety disorder, unspecified: Secondary | ICD-10-CM | POA: Diagnosis not present
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
## 2023-05-19 ENCOUNTER — Telehealth (HOSPITAL_COMMUNITY): Payer: Self-pay | Admitting: Licensed Clinical Social Worker

## 2023-05-19 NOTE — Telephone Encounter (Signed)
Opened in error

## 2023-05-19 NOTE — Telephone Encounter (Signed)
Clinician received notification that pt called and requested emergency appointment with provider. Staff unable since provider not full time clinician in outpatient department.  Clinician returned pt call to assess urgency of need--pt reports no SI, HI, or AVH.   Discussed scheduling virtual session at 1pm tomorrow 8/2.    Discussed pt contact [facility] DSS for supports due to recent housing loss.

## 2023-05-20 ENCOUNTER — Ambulatory Visit (HOSPITAL_COMMUNITY): Payer: 59 | Admitting: Licensed Clinical Social Worker

## 2023-05-20 DIAGNOSIS — F419 Anxiety disorder, unspecified: Secondary | ICD-10-CM

## 2023-05-20 DIAGNOSIS — F4310 Post-traumatic stress disorder, unspecified: Secondary | ICD-10-CM

## 2023-05-20 NOTE — Patient Instructions (Addendum)
[facility] [employer])  PaidValue.com.cy   Abuse / Adult Protective Services  Friday, May 13, 2023  1:04:08 PM  See also: MENTAL HEALTH RESOURCES & SERVICESThe Department of Social Services (DSS) in each county  evaluates allegations of abuse, neglect and exploitation of disabled adults and provides services to persons in  need of protection. Reports are kept confidential and may be made anonymously. If there is even a suspicion of  abuse, a report should be made so that the situation can be assessed. Call 911 if after hours, weekends &  holidays, or if someone is in danger.  Departments of Social Services (DSS)  [facility] Department of Social Services ? APS  [address]  [place], [place] [address]  Phone: [phone]  www.SemiTrust.tn  [facility] Department of Social Services ? APS  [address] [place], [place] [address]  Phone: [phone]  BluetoothSpecialist.co.nz   [facility] Department of Social Services ? APS  [address], [place] [address]  Phone: [phone]  Email:  dssinfo@[place] .gov  RankCanada.com.cy   Law Enforcement Agencies  In a police or medical emergency, call 911. Some law enforcement agencies may offer non?police crisis  assistance through specially trained crisis units. In a non?emergency, call the agency below that serves your  residence.  [facility] Police Department  [address] [place], [place] [address]  Phone: [phone]  www.carrboropolice.com   Page [address] of [address] Police Department Crisis Unit  Phone: [phone]  www.townof[place].org  NON?police crisis assistance for [facility] residents only. NOT police response. In a police or medical  emergency, call 911.   [facility] Police Department Crisis Unit  [address]  [place], [place] [address]  Phone: [phone]  Alt Phone:  [phone]  www.[place]of[place].org  Email:  policecrisisunit@[place]ofchapelhill .org  The Crisis Unit is a 24?hour co?response team that provides onsite emergency response with officers to  persons in crisis situations. Responses are to [facility] residents only. In a police or medical emergency,  dial 911.   [facility] Police Department  [address] [place], [place] [address]  Phone: [phone]  http://www.[email or web address]-[email or web address]/   [place] Police Department  [address], [place] [address]  Phone: [phone]  www.cityof[place].com/police.asp  Email:  records@mebanepd .com  COUNTIES SERVED: [place], [facility] Department ? Crisis Unit  [address] [place], [place] [address]  Phone: [phone]  Alt Phone: [phone]  http://rogers.info/   [facility] Sheriff's Office Crisis Intervention Unit works with the criminal justice system and other  community agencies to provide coordinated services for victims of crime. COUNTY SERVED: [place]  Abuse / Family Violence Prevention Agencies   Page [address] of [address] [facility]  [address]  [place], [place] [address]  Phone: [phone]  Alt Phone: [phone]  www.compassctr.org  24?hour crisis line offers support and crisis counseling for victims of domestic violence.  Helps people  develop a safety plan and provides cell phones to call 911. [place] / [place]. Support groups for women.  Free and confidential. COUNTIES SERVED: [facility] for Women and Families  [place], [place]  Phone: [phone]  Alt Phone: [phone]  www.compassctr.org  24?hour crisis line offers support, information and referral, court advocacy and crisis counseling for victims  of domestic violence. Helps people develop a safety plan and provides cell phones to call 911. [place] /  [place]. Support groups  for women. Free and confidential. AREA SERVED: All, [facility] for Women and Families  [address]  [place], [place] [address]  Phone: [phone]  Alt Phone: [phone]  www.compassctr.org  Email:  clientservices@compassctr .org  Offers a broad range of services to 6,000+ people each year, including career and financial education and  individual counseling, legal resources, and support groups. We are the only [facility] for  comprehensive domestic violence crisis services. COUNTIES SERVED: [place], [place], [place], [place],  [place]   [facility]  [address]  [place], [place] [address]  Phone: [phone]  Alt Phone: [phone]  PoshApartments.no  Email:  director@hopeline ?RefurbishedBikes.be  [facility] supports people and saves lives during times of crisis through caring, confidential conversations.  Programs include a crisis phone line, text, and reassurance program. For more information on all our  programs and how to get involved, please visit PoshApartments.no.  [place]  Page 3 of 241  [facility]  Free confidential counseling and group support for victims of sexual assault including their friends or loved  ones. Also offers community education and therapy referrals. COUNTIES SERVED: [place]  [address]  [place], [place] [address]  Phone: [phone]  Alt Phone: [phone])[phone] or [phone]  Email:  info@ocrcc .org  GraySmoke.es   [facility]  [facility] serving the community for Home care, mental health, DWI assessments, Group  therapy, Therapy, Weight loss, Meditation, Addiction treatment and therapy. AREA SERVED: All  [address] [facility]  [address]  [place], [place] [address]  Phone: [phone]  Email:  sreliablecare@yahoo .com  www.reliablehealthservices.com  [facility]  AREA SERVED: Statewide  [address]  [place], [place] [address]  Phone: [phone]  Outpatient Psychiatry and Counseling  FOR  CRISIS:  call 911, Therapeutic Alternatives: Mobile Crisis Management 24 hours:  [phone], call 988, [facility]--[address] walk in), or go to your local EMERGENCY DEPARTMENT  [facility] [address], [place], [place] [address]  [phone]  [facility] [address] [place], [place] [address] [phone]  [facility] [address] [place],  [place]  [address] [phone]  [facility] Address: [address] [place], [place] [address] Phone: [phone]  [facility] [place] and [place] Locations) [email or web address]  [employer] fee and walk in schedule: M-F 8am-12pm/1pm-3pm [address]  [place], [place] [address] [phone]  [facility] [address] [place], [place] [address] [phone]  [facility] Outpatient Services/ Intensive Outpatient Therapy Program/CDIOP/PHP [address] [place], [place] [address] [phone]  [facility] Urgent [facility], Outpatient Therapy Services, [place] in [place]      [phone]     [address]    [place], [place] [address]                 [facility]   [facility] [phone]. [address] [place], [place] [address]  [employer]          [address]  [place], [place] [address]       [phone]  [facility] [address] [place], [place]  [address] [place], [place] [address] [phone]  [facility]    [address]    [place], [place] [address]     [phone]       [facility] [address] [place] [address]  [facility]     [address] [place], [place]       [phone]       [facility] Eulogio Ditch, MD [address] [place], [place] [address] [phone]  [facility]     [address]     [place], [place] [address]     [phone]       [facility] [address] [place], [place] [address] [phone] Resources for Temporary Residential Assistance/Crisis Centers
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
## 2023-07-12 IMAGING — CR DG HIP (WITH OR WITHOUT PELVIS) 2-3V*R*
3 series · 3 of 3 positions shown · non-contrast
Comparison: None.

CLINICAL DATA: Bilateral hip pain with buckling. Broken tailbone 15
years ago.

EXAM:
DG HIP (WITH OR WITHOUT PELVIS) 2-3V RIGHT; DG HIP (WITH OR WITHOUT
PELVIS) 2-3V LEFT

[pelvis ap]
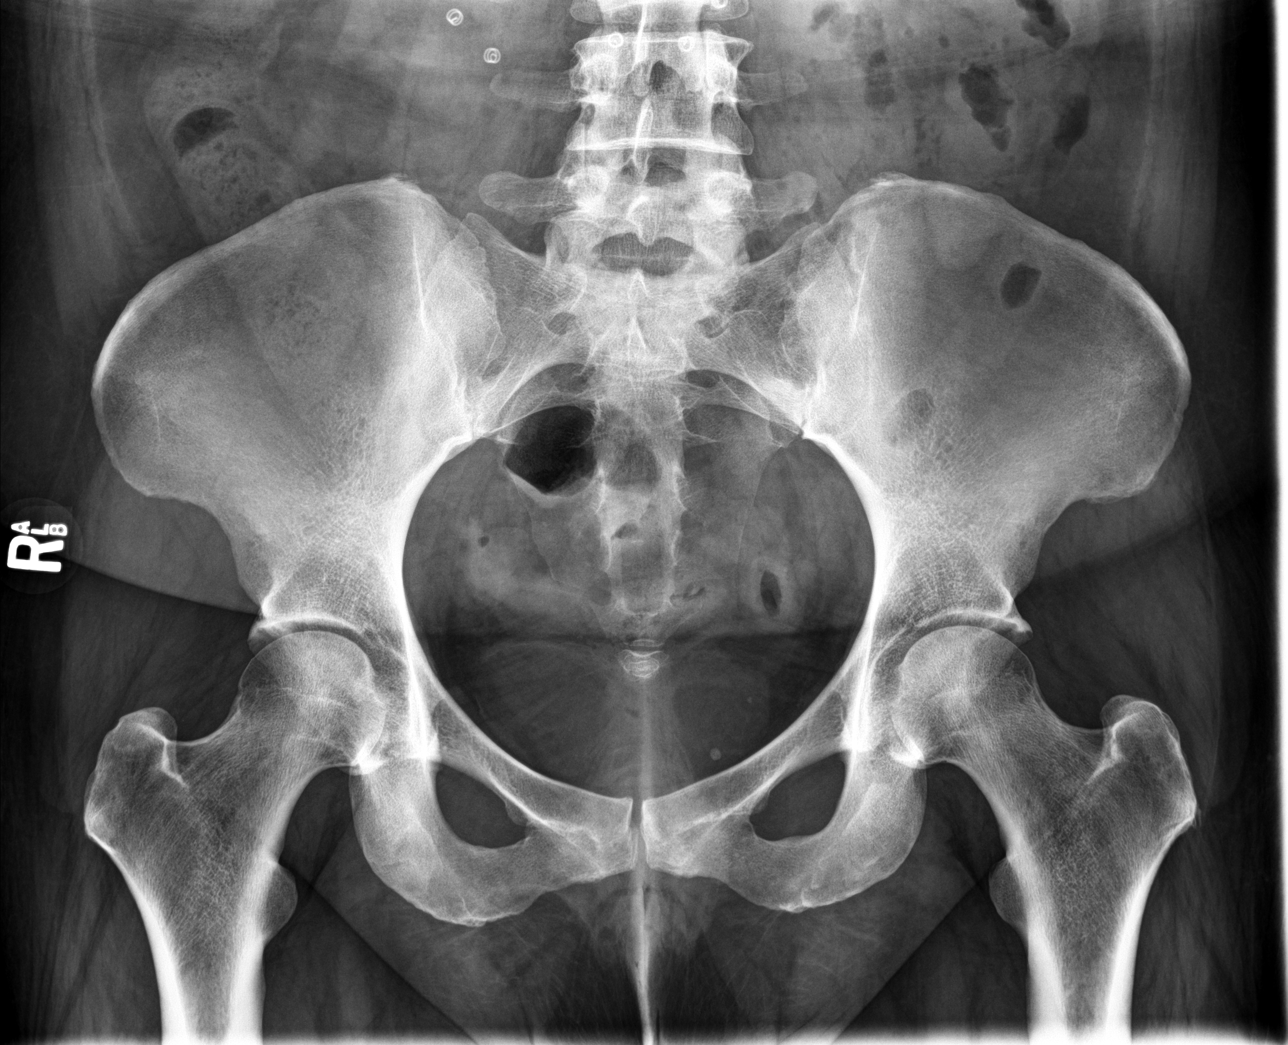

[hip ap]
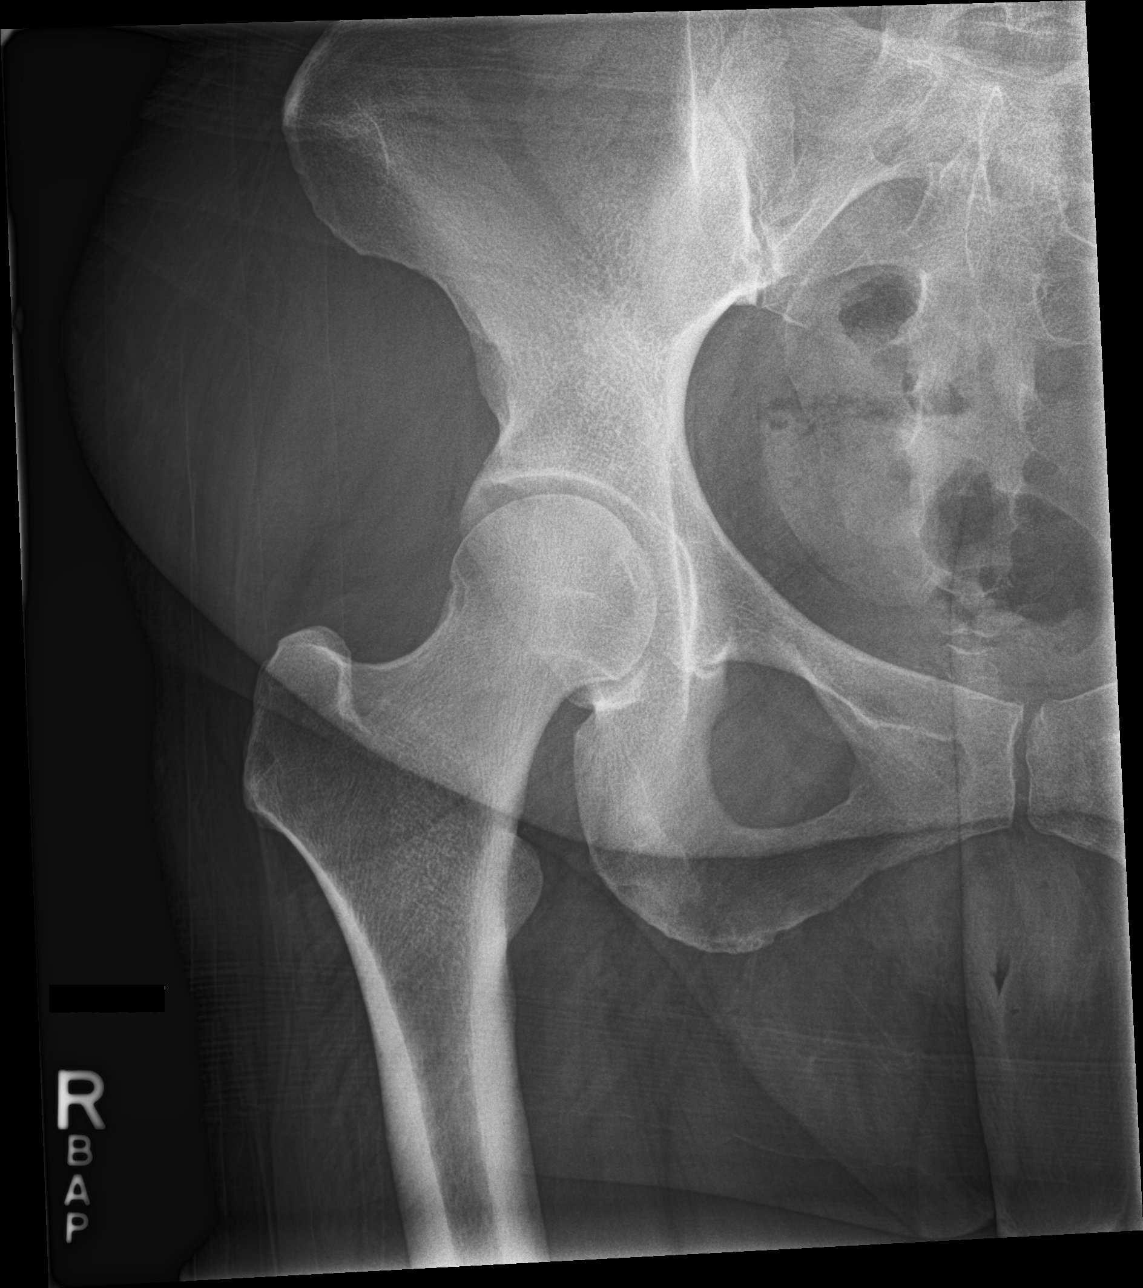

[hip lat]
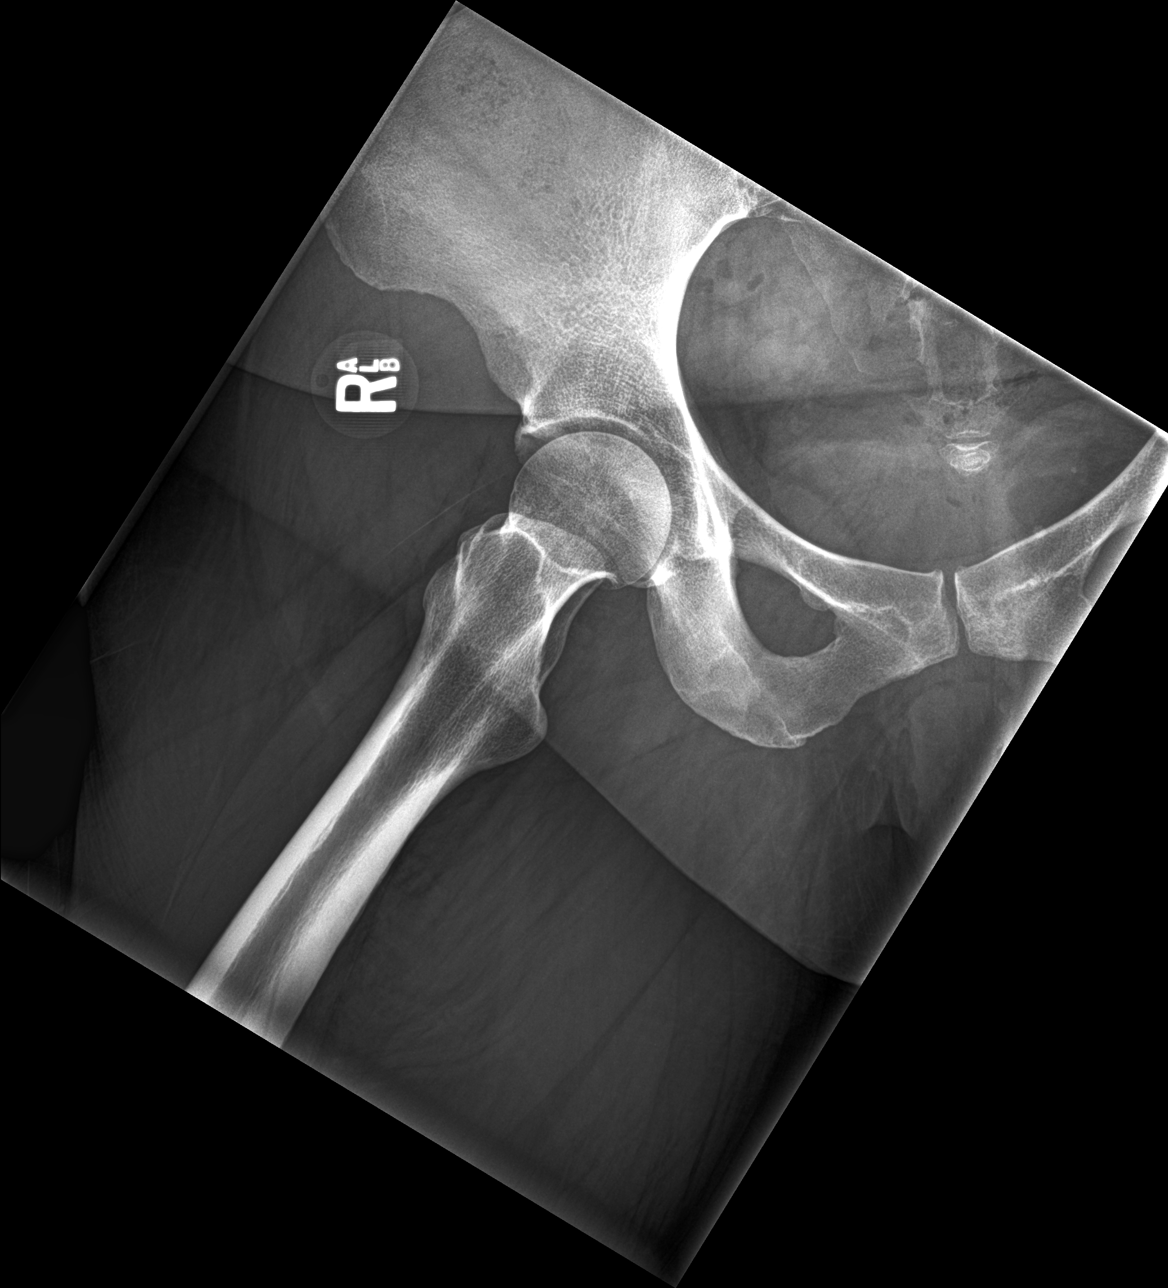

[3 of 3 positions shown; findings below may reference images not displayed]

FINDINGS: The bilateral sacroiliac bilateral femoroacetabular and pubic
symphysis joint spaces are maintained. Minimal degenerative
irregularity of the pubic symphysis articular surfaces. Mild
bilateral superolateral left greater than right acetabular
degenerative osteophytosis. Normal morphology of the bilateral
femoral head-neck junctions without CAM-type bump deformity.

No acute fracture or dislocation.
IMPRESSION: Mild left greater than right superolateral acetabular degenerative
osteophytosis.
